# Patient Record
Sex: Female | Born: 1970 | Race: White | Hispanic: No | Marital: Single | Smoking: Never smoker
Health system: Southern US, Community
[De-identification: ages and names within clinical notes are randomized; demographics above are authoritative.]

## PROBLEM LIST (undated history)

## (undated) DIAGNOSIS — I872 Venous insufficiency (chronic) (peripheral): Secondary | ICD-10-CM

## (undated) DIAGNOSIS — I38 Endocarditis, valve unspecified: Secondary | ICD-10-CM

## (undated) DIAGNOSIS — I071 Rheumatic tricuspid insufficiency: Secondary | ICD-10-CM

## (undated) DIAGNOSIS — I82409 Acute embolism and thrombosis of unspecified deep veins of unspecified lower extremity: Secondary | ICD-10-CM

## (undated) DIAGNOSIS — I2510 Atherosclerotic heart disease of native coronary artery without angina pectoris: Secondary | ICD-10-CM

## (undated) DIAGNOSIS — I87321 Chronic venous hypertension (idiopathic) with inflammation of right lower extremity: Secondary | ICD-10-CM

## (undated) DIAGNOSIS — G629 Polyneuropathy, unspecified: Secondary | ICD-10-CM

## (undated) DIAGNOSIS — R55 Syncope and collapse: Secondary | ICD-10-CM

## (undated) HISTORY — PX: GALLBLADDER SURGERY: SHX652

## (undated) HISTORY — DX: Rheumatic tricuspid insufficiency: I07.1

## (undated) HISTORY — DX: Venous insufficiency (chronic) (peripheral): I87.2

## (undated) HISTORY — DX: Acute embolism and thrombosis of unspecified deep veins of unspecified lower extremity: I82.409

## (undated) HISTORY — DX: Chronic venous hypertension (idiopathic) with inflammation of right lower extremity: I87.321

## (undated) HISTORY — DX: Syncope and collapse: R55

## (undated) HISTORY — DX: Endocarditis, valve unspecified: I38

## (undated) HISTORY — PX: THYROIDECTOMY: SHX17

## (undated) HISTORY — PX: BACK SURGERY: SHX140

## (undated) HISTORY — PX: CARPAL TUNNEL RELEASE: SHX101

## (undated) HISTORY — PX: SLEEVE GASTROPLASTY: SHX1101

## (undated) HISTORY — PX: TOTAL HIP ARTHROPLASTY: SHX124

## (undated) HISTORY — DX: Atherosclerotic heart disease of native coronary artery without angina pectoris: I25.10

## (undated) HISTORY — PX: APPENDECTOMY: SHX54

## (undated) HISTORY — PX: KNEE SURGERY: SHX244

## (undated) HISTORY — PX: LAMINECTOMY: SHX219

## (undated) HISTORY — DX: Polyneuropathy, unspecified: G62.9

---

## 2021-05-22 ENCOUNTER — Encounter (HOSPITAL_COMMUNITY): Payer: Self-pay | Admitting: Emergency Medicine

## 2021-05-22 ENCOUNTER — Other Ambulatory Visit: Payer: Self-pay

## 2021-05-22 ENCOUNTER — Emergency Department (HOSPITAL_COMMUNITY)
Admission: EM | Admit: 2021-05-22 | Discharge: 2021-05-23 | Disposition: A | Discharge status: Home / Self Care | Payer: Medicare Other | Attending: Emergency Medicine | Admitting: Emergency Medicine

## 2021-05-22 DIAGNOSIS — R112 Nausea with vomiting, unspecified: Secondary | ICD-10-CM | POA: Insufficient documentation

## 2021-05-22 DIAGNOSIS — R1084 Generalized abdominal pain: Secondary | ICD-10-CM | POA: Insufficient documentation

## 2021-05-22 LAB — URINALYSIS, MICROSCOPIC (REFLEX): RBC / HPF: NONE SEEN RBC/hpf (ref 0–5)

## 2021-05-22 LAB — URINALYSIS, ROUTINE W REFLEX MICROSCOPIC
Bilirubin Urine: NEGATIVE
Glucose, UA: NEGATIVE mg/dL
Ketones, ur: NEGATIVE mg/dL
Leukocytes,Ua: NEGATIVE
Nitrite: NEGATIVE
Protein, ur: NEGATIVE mg/dL
Specific Gravity, Urine: <1.005 — ABNORMAL LOW (ref 1.005–1.030)
pH: 6.5 (ref 5.0–8.0)

## 2021-05-22 LAB — COMPREHENSIVE METABOLIC PANEL
ALT: 97 U/L — ABNORMAL HIGH (ref 0–44)
AST: 55 U/L — ABNORMAL HIGH (ref 15–41)
Albumin: 3.2 g/dL — ABNORMAL LOW (ref 3.5–5.0)
Alkaline Phosphatase: 212 U/L — ABNORMAL HIGH (ref 38–126)
Anion gap: 3 — ABNORMAL LOW (ref 5–15)
BUN: <5 mg/dL — ABNORMAL LOW (ref 6–20)
CO2: 22 mmol/L (ref 22–32)
Calcium: 8.3 mg/dL — ABNORMAL LOW (ref 8.9–10.3)
Chloride: 113 mmol/L — ABNORMAL HIGH (ref 98–111)
Creatinine, Ser: 0.93 mg/dL (ref 0.44–1.00)
GFR, Estimated: >60 mL/min (ref >60)
Glucose, Bld: 92 mg/dL (ref 70–99)
Potassium: 3.7 mmol/L (ref 3.5–5.1)
Sodium: 138 mmol/L (ref 135–145)
Total Bilirubin: 0.8 mg/dL (ref 0.3–1.2)
Total Protein: 5.9 g/dL — ABNORMAL LOW (ref 6.5–8.1)

## 2021-05-22 LAB — CBC WITH DIFFERENTIAL/PLATELET
Abs Immature Granulocytes: 0.01 10*3/uL (ref 0.00–0.07)
Basophils Absolute: 0 10*3/uL (ref 0.0–0.1)
Basophils Relative: 0 %
Eosinophils Absolute: 0.1 10*3/uL (ref 0.0–0.5)
Eosinophils Relative: 2 %
HCT: 32.8 % — ABNORMAL LOW (ref 36.0–46.0)
Hemoglobin: 9.4 g/dL — ABNORMAL LOW (ref 12.0–15.0)
Immature Granulocytes: 0 %
Lymphocytes Relative: 19 %
Lymphs Abs: 0.7 10*3/uL (ref 0.7–4.0)
MCH: 25.2 pg — ABNORMAL LOW (ref 26.0–34.0)
MCHC: 28.7 g/dL — ABNORMAL LOW (ref 30.0–36.0)
MCV: 87.9 fL (ref 80.0–100.0)
Monocytes Absolute: 0.2 10*3/uL (ref 0.1–1.0)
Monocytes Relative: 6 %
Neutro Abs: 2.8 10*3/uL (ref 1.7–7.7)
Neutrophils Relative %: 73 %
Platelets: 239 10*3/uL (ref 150–400)
RBC: 3.73 MIL/uL — ABNORMAL LOW (ref 3.87–5.11)
RDW: 17.2 % — ABNORMAL HIGH (ref 11.5–15.5)
WBC: 3.8 10*3/uL — ABNORMAL LOW (ref 4.0–10.5)
nRBC: 0 % (ref 0.0–0.2)

## 2021-05-22 LAB — LIPASE, BLOOD: Lipase: 27 U/L (ref 11–51)

## 2021-05-22 LAB — PROTIME-INR
INR: 1.4 — ABNORMAL HIGH (ref 0.8–1.2)
Prothrombin Time: 17.3 seconds — ABNORMAL HIGH (ref 11.4–15.2)

## 2021-05-22 MED ORDER — ONDANSETRON 4 MG PO TBDP
4.0000 mg | ORAL_TABLET | Freq: Once | ORAL | Status: AC
  Administered 2021-05-22: 4 mg via ORAL
  Filled 2021-05-22: qty 1

## 2021-05-22 NOTE — ED Notes (Signed)
Patients states one episode of emesis. Triage RN notified

## 2021-05-22 NOTE — ED Provider Notes (Signed)
Emergency Medicine Provider Triage Evaluation Note  Illona Bulman , a 50 y.o. female  was evaluated in triage.  Pt complains of epigastric pain, nausea, vomiting that is been ongoing since today.  Patient was discharged from [facility] regional yesterday after cessation of a prior GI bleed.  She is on Pradaxa for prior history of DVTs but has not taken his medication in several days.  Reports then being unable to keep anything down.  Did have a meal yesterday consisting of corn along with stuffing.  No bowel movements in the last 3 days.  Ending is nonbloody, nonbilious.  Review of Systems  Positive: Nausea, vomiting, abdominal pain Negative: Fever, diarrhea, blood in stool  Physical Exam  There were no vitals taken for this visit. Gen:   Awake, no distress   Resp:  Normal effort  MSK:   Moves extremities without difficulty  Other:  Pain with palpation along the epigastric and lower abdominal region, bowel sounds are decreased.  Medical Decision Making  Medically screening exam initiated at 5:12 PM.  Appropriate orders placed.  Titus Dubin was informed that the remainder of the evaluation will be completed by another provider, this initial triage assessment does not replace that evaluation, and the importance of remaining in the ED until their evaluation is complete.  Prior hx of GI bleed previously on Pradaxa, had intervention by GI at [facility].  Here with worsening pain, nausea, vomiting.   Claude Manges, PA-C 05/22/21 1717    Mancel Bale, MD 05/22/21 [phone]

## 2021-05-22 NOTE — ED Triage Notes (Signed)
Patient here for evaluation of abdominal pain that started a few days ago, history of a GI bleed and takes pradaxa. Patient alert, oriented, and in no apparent distress at this time.

## 2021-05-23 ENCOUNTER — Emergency Department (HOSPITAL_COMMUNITY): Payer: Medicare Other

## 2021-05-23 ENCOUNTER — Encounter (HOSPITAL_COMMUNITY): Payer: Self-pay | Admitting: Emergency Medicine

## 2021-05-23 MED ORDER — ONDANSETRON 4 MG PO TBDP: ORAL_TABLET | ORAL | 0 refills | Status: AC

## 2021-05-23 MED ORDER — ONDANSETRON HCL 4 MG/2ML IJ SOLN
4.0000 mg | Freq: Once | INTRAMUSCULAR | Status: AC
  Administered 2021-05-23: 4 mg via INTRAVENOUS
  Filled 2021-05-23: qty 2

## 2021-05-23 MED ORDER — MORPHINE SULFATE (PF) 4 MG/ML IV SOLN
4.0000 mg | Freq: Once | INTRAVENOUS | Status: AC
  Administered 2021-05-23: 4 mg via INTRAVENOUS
  Filled 2021-05-23: qty 1

## 2021-05-23 MED ORDER — IOHEXOL 300 MG/ML  SOLN
100.0000 mL | Freq: Once | INTRAMUSCULAR | Status: AC | PRN
  Administered 2021-05-23: 100 mL via INTRAVENOUS

## 2021-05-23 MED ORDER — PROMETHAZINE HCL 25 MG RE SUPP: 25.0000 mg | Freq: Four times a day (QID) | RECTAL | 0 refills | Status: DC | PRN

## 2021-05-23 MED ORDER — ALUM & MAG HYDROXIDE-SIMETH 200-200-20 MG/5ML PO SUSP
30.0000 mL | Freq: Once | ORAL | Status: AC
  Administered 2021-05-23: 30 mL via ORAL
  Filled 2021-05-23: qty 30

## 2021-05-23 MED ORDER — FAMOTIDINE IN NACL 20-0.9 MG/50ML-% IV SOLN
20.0000 mg | Freq: Once | INTRAVENOUS | Status: AC
  Administered 2021-05-23: 20 mg via INTRAVENOUS
  Filled 2021-05-23: qty 50

## 2021-05-23 MED ORDER — SODIUM CHLORIDE 0.9 % IV BOLUS
1000.0000 mL | Freq: Once | INTRAVENOUS | Status: AC
  Administered 2021-05-23: 1000 mL via INTRAVENOUS

## 2021-05-23 NOTE — Discharge Instructions (Addendum)
Try pepcid or tagamet up to twice a day.  Try to avoid things that may make this worse, most commonly these are spicy foods tomato based products fatty foods chocolate and peppermint.  Alcohol and tobacco can also make this worse.  Return to the emergency department for sudden worsening pain fever or inability to eat or drink.  

## 2021-05-23 NOTE — ED Provider Notes (Signed)
[facility] EMERGENCY DEPARTMENT Provider Note   CSN: [id number] Arrival date & time: 05/22/21  1647     History Chief Complaint  Patient presents with   Abdominal Pain    Renee Joseph is a 50 y.o. female.  64 yoF with a chief complaints of diffuse abdominal pain nausea and vomiting after having a colonoscopy and endoscopy a couple days ago.  The patient was hospitalized for a GI bleed while on Pradaxa.  She was discharged yesterday and started having intractable nausea and vomiting.  Not able to keep anything down.  No fevers or chills.  Pain to the abdomen is worse to the left upper quadrant.  The history is provided by the patient.  Abdominal Pain Pain location:  Generalized Pain quality: aching and cramping   Pain radiates to:  Does not radiate Pain severity:  Moderate Onset quality:  Gradual Duration:  2 days Timing:  Constant Progression:  Worsening Chronicity:  New Relieved by:  Nothing Worsened by:  Nothing Ineffective treatments:  None tried Associated symptoms: nausea and vomiting   Associated symptoms: no chest pain, no chills, no dysuria, no fever and no shortness of breath       History reviewed. No pertinent past medical history.  There are no problems to display for this patient.   History reviewed. No pertinent surgical history.   OB History   No obstetric history on file.     No family history on file.     Home Medications Prior to Admission medications   Medication Sig Start Date End Date Taking? Authorizing Provider  ondansetron (ZOFRAN-ODT) 4 MG disintegrating tablet 4mg  ODT q4 hours prn nausea/vomit 05/23/21  Yes Deno Etienne, DO  promethazine (PHENERGAN) 25 MG suppository Place 1 suppository (25 mg total) rectally every 6 (six) hours as needed for nausea or vomiting. 05/23/21  Yes Deno Etienne, DO    Allergies    Crab extract allergy skin test, Sulfa antibiotics, Erythromycin base, Ketorolac, Latex, Nabumetone,  Pedi-pre tape spray [wound dressing adhesive], and Penicillins  Review of Systems   Review of Systems  Constitutional:  Negative for chills and fever.  HENT:  Negative for congestion and rhinorrhea.   Eyes:  Negative for redness and visual disturbance.  Respiratory:  Negative for shortness of breath and wheezing.   Cardiovascular:  Negative for chest pain and palpitations.  Gastrointestinal:  Positive for abdominal pain, nausea and vomiting.  Genitourinary:  Negative for dysuria and urgency.  Musculoskeletal:  Negative for arthralgias and myalgias.  Skin:  Negative for pallor and wound.  Neurological:  Negative for dizziness and headaches.   Physical Exam Updated Vital Signs BP 93/64 (BP Location: Left Arm)   Pulse (!) 56   Temp 98.1 F (36.7 C)   Resp 18   SpO2 100%   Physical Exam Vitals and nursing note reviewed.  Constitutional:      General: She is not in acute distress.    Appearance: She is well-developed. She is not diaphoretic.  HENT:     Head: Normocephalic and atraumatic.  Eyes:     Pupils: Pupils are equal, round, and reactive to light.  Cardiovascular:     Rate and Rhythm: Normal rate and regular rhythm.     Heart sounds: No murmur heard.   No friction rub. No gallop.  Pulmonary:     Effort: Pulmonary effort is normal.     Breath sounds: No wheezing or rales.  Abdominal:     General: There  is no distension.     Palpations: Abdomen is soft.     Tenderness: There is abdominal tenderness (mild diffuse tenderness).  Musculoskeletal:        General: No tenderness.     Cervical back: Normal range of motion and neck supple.  Skin:    General: Skin is warm and dry.  Neurological:     Mental Status: She is alert and oriented to person, place, and time.  Psychiatric:        Behavior: Behavior normal.    ED Results / Procedures / Treatments   Labs (all labs ordered are listed, but only abnormal results are displayed) Labs Reviewed  CBC WITH  DIFFERENTIAL/PLATELET - Abnormal; Notable for the following components:      Result Value   WBC 3.8 (*)    RBC 3.73 (*)    Hemoglobin 9.4 (*)    HCT 32.8 (*)    MCH 25.2 (*)    MCHC 28.7 (*)    RDW 17.2 (*)    All other components within normal limits  COMPREHENSIVE METABOLIC PANEL - Abnormal; Notable for the following components:   Chloride 113 (*)    BUN <5 (*)    Calcium 8.3 (*)    Total Protein 5.9 (*)    Albumin 3.2 (*)    AST 55 (*)    ALT 97 (*)    Alkaline Phosphatase 212 (*)    Anion gap 3 (*)    All other components within normal limits  URINALYSIS, ROUTINE W REFLEX MICROSCOPIC - Abnormal; Notable for the following components:   Specific Gravity, Urine <1.005 (*)    Hgb urine dipstick TRACE (*)    All other components within normal limits  PROTIME-INR - Abnormal; Notable for the following components:   Prothrombin Time 17.3 (*)    INR 1.4 (*)    All other components within normal limits  URINALYSIS, MICROSCOPIC (REFLEX) - Abnormal; Notable for the following components:   Bacteria, UA RARE (*)    All other components within normal limits  LIPASE, BLOOD    EKG None  Radiology CT ABDOMEN PELVIS W CONTRAST  Result Date: 05/23/2021 CLINICAL DATA:  Abdominal pain EXAM: CT ABDOMEN AND PELVIS WITH CONTRAST TECHNIQUE: Multidetector CT imaging of the abdomen and pelvis was performed using the standard protocol following bolus administration of intravenous contrast. CONTRAST:  OMNIPAQUE IOHEXOL 300 MG/ML  SOLN COMPARISON:  CT abdomen and pelvis 05/19/2021 FINDINGS: Lower chest: No acute abnormality. Hepatobiliary: Liver is normal in size and contour with no suspicious mass identified. Gallbladder is surgically absent. Mildly dilated extrahepatic biliary ducts, common bile duct measures 9 mm in diameter with tapering distally. Likely compensatory from cholecystectomy. Pancreas: Unremarkable. No pancreatic ductal dilatation or surrounding inflammatory changes. Spleen:  Normal in size without focal abnormality. Adrenals/Urinary Tract: Adrenal glands are unremarkable. Kidneys are normal, without renal calculi, focal lesion, or hydronephrosis. Bladder is grossly unremarkable, limited evaluation due to metallic artifacts. Stomach/Bowel: Postsurgical changes of the stomach and bowel, unchanged. No bowel obstruction, free air or pneumatosis. No bowel wall edema identified. Appendectomy changes. Vascular/Lymphatic: No significant vascular findings are present. No enlarged abdominal or pelvic lymph nodes. Reproductive: Uterus and bilateral adnexa are unremarkable. Other: No ascites. Musculoskeletal: Degenerative and postsurgical changes in the lumbar spine. Bilateral hip arthroplasties. No suspicious bony lesions identified. IMPRESSION: No acute process identified. Stable postsurgical and chronic changes. Electronically Signed   By: Jannifer Hick M.D.   On: 05/23/2021 09:15    Procedures Procedures  Medications Ordered in ED Medications  ondansetron (ZOFRAN-ODT) disintegrating tablet 4 mg (4 mg Oral Given 05/22/21 1753)  morphine 4 MG/ML injection 4 mg (4 mg Intravenous Given 05/23/21 0815)  ondansetron (ZOFRAN) injection 4 mg (4 mg Intravenous Given 05/23/21 0810)  sodium chloride 0.9 % bolus 1,000 mL (0 mLs Intravenous Stopped 05/23/21 0954)  famotidine (PEPCID) IVPB 20 mg premix (0 mg Intravenous Stopped 05/23/21 0849)  iohexol (OMNIPAQUE) 300 MG/ML solution 100 mL (100 mLs Intravenous Contrast Given 05/23/21 0906)  ondansetron (ZOFRAN) injection 4 mg (4 mg Intravenous Given 05/23/21 0929)  alum & mag hydroxide-simeth (MAALOX/MYLANTA) 200-200-20 MG/5ML suspension 30 mL (30 mLs Oral Given 05/23/21 [id number])    ED Course  I have reviewed the triage vital signs and the nursing notes.  Pertinent labs & imaging results that were available during my care of the patient were reviewed by me and considered in my medical decision making (see chart for details).    MDM  Rules/Calculators/A&P                           50 yo F with a cc of n/v abdominal pain after having endoscopy and colonoscopy while at [facility].  We will obtain a CT scan to evaluate for possible perforation.  Hemoglobin is somewhat higher than it was at last check.  She does have mild LFT elevation of unknown significance.  Give pain and nausea medicine.  Bolus of IV fluids.  Pepcid.  Reassess.  CT without concerning finding.  Symptoms improved.  Oral trial.    Tolerating by mouth without issue.   9:55 AM:  I have discussed the diagnosis/risks/treatment options with the patient and believe the pt to be eligible for discharge home to follow-up with GI, PCP. We also discussed returning to the ED immediately if new or worsening sx occur. We discussed the sx which are most concerning (e.g., sudden worsening pain, fever, inability to tolerate by mouth) that necessitate immediate return. Medications administered to the patient during their visit and any new prescriptions provided to the patient are listed below.  Medications given during this visit Medications  ondansetron (ZOFRAN-ODT) disintegrating tablet 4 mg (4 mg Oral Given 05/22/21 1753)  morphine 4 MG/ML injection 4 mg (4 mg Intravenous Given 05/23/21 0815)  ondansetron (ZOFRAN) injection 4 mg (4 mg Intravenous Given 05/23/21 0810)  sodium chloride 0.9 % bolus 1,000 mL (0 mLs Intravenous Stopped 05/23/21 0954)  famotidine (PEPCID) IVPB 20 mg premix (0 mg Intravenous Stopped 05/23/21 0849)  iohexol (OMNIPAQUE) 300 MG/ML solution 100 mL (100 mLs Intravenous Contrast Given 05/23/21 0906)  ondansetron (ZOFRAN) injection 4 mg (4 mg Intravenous Given 05/23/21 0929)  alum & mag hydroxide-simeth (MAALOX/MYLANTA) 200-200-20 MG/5ML suspension 30 mL (30 mLs Oral Given 05/23/21 [id number])     The patient appears reasonably screen and/or stabilized for discharge and I doubt any other medical condition or other [facility] requiring further screening, evaluation, or  treatment in the ED at this time prior to discharge.    Final Clinical Impression(s) / ED Diagnoses Final diagnoses:  Nausea and vomiting in adult    Rx / DC Orders ED Discharge Orders          Ordered    promethazine (PHENERGAN) 25 MG suppository  Every 6 hours PRN        05/23/21 0934    ondansetron (ZOFRAN-ODT) 4 MG disintegrating tablet        05/23/21 0934  Deno Etienne, DO 05/23/21 [phone]

## 2021-05-28 ENCOUNTER — Ambulatory Visit: Payer: Self-pay | Admitting: Cardiovascular Disease

## 2021-06-29 NOTE — Progress Notes (Signed)
Cardiology Office Note:    Date:  06/30/2021   ID:  Renee Joseph, DOB [birth year], MRN [id number]  PCP:  System, Provider Not In   [facility] Providers Cardiologist:  Marylyn Appenzeller   Referring MD: No ref. provider found   Chief Complaint  Patient presents with   Coronary Artery Disease   Palpitations    Jan. 9 2023    Renee Joseph is a 51 y.o. female with a hx of CAD, ( mild CAD by CT angiogram )  DVT,    She is seen for the first time today for follow up of episodes of CP, palpitations, dyspnea.  She had several studies while out in [place]  Echo shows normal LV systolic function - EF 70%.  Trivial MR, mild TR  Carotid duplex showed no significant carotid artery disease  Has been on pradaxa for several years for DVT   Having more DOE ,  More palpitations - usually at rest   No regularl exercise  Had left knee replacement - 8-9 months ago .    Is limited by her joint pain  Has hereditary DJJD.  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 Sodium level was 140.  Potassium is 4.5.  Chloride is 110, BUN is 8, creatinine is 0.72   Past Medical History:  Diagnosis Date   CAD (coronary artery disease)    Chronic venous hypertension (idiopathic) with inflammation of right lower extremity    Chronic venous insufficiency    DVT (deep venous thrombosis) (HCC)    Neuropathy    Syncope, vasovagal    Tricuspid valve regurgitation    Valvular heart disease     Past Surgical History:  Procedure Laterality Date   APPENDECTOMY     BACK SURGERY     CARPAL TUNNEL RELEASE     GALLBLADDER SURGERY     KNEE SURGERY Bilateral    LAMINECTOMY     SLEEVE GASTROPLASTY     THYROIDECTOMY     TOTAL HIP ARTHROPLASTY      Current  Medications: Current Meds  Medication Sig   ALPRAZolam (XANAX) 0.5 MG tablet Take 0.5 mg by mouth every morning.   cyclobenzaprine (FLEXERIL) 5 MG tablet Take 5 mg by mouth 3 (three) times daily as needed.   dabigatran (PRADAXA) 150 MG CAPS capsule Take 150 mg by mouth 2 (two) times daily.   furosemide (LASIX) 40 MG tablet Take 40 mg by mouth.   linaclotide (LINZESS) 145 MCG CAPS capsule Take 145 mcg by mouth daily.   metoprolol tartrate (LOPRESSOR) 25 MG tablet Take 0.5 tablets (12.5 mg total) by mouth 2 (two) times daily.   omeprazole (PRILOSEC) 20 MG capsule Take 20 mg by mouth daily.   ondansetron (ZOFRAN-ODT) 4 MG disintegrating tablet 4mg  ODT q4 hours prn nausea/vomit   pantoprazole (PROTONIX) 40 MG tablet Take 40 mg by mouth daily.   promethazine (PHENERGAN) 25 MG suppository Place 1 suppository (25 mg total) rectally every 6 (six) hours as needed for nausea or vomiting.   sucralfate (CARAFATE) 1 g tablet Take 1 g by mouth 4 (four) times daily -  with meals and at bedtime.   zolpidem (  AMBIEN) 10 MG tablet Take 10 mg by mouth at bedtime as needed.   [DISCONTINUED] cyclobenzaprine (FLEXERIL) 10 MG tablet Take 5 mg by mouth 3 (three) times daily as needed for muscle spasms.     Allergies:   Crab extract allergy skin test, Other, Sulfa antibiotics, Tramadol, Erythromycin base, Ketorolac, Latex, Nabumetone, Pedi-pre tape spray [wound dressing adhesive], Penicillins, and Pregabalin   Social History   Socioeconomic History   Marital status: Single    Spouse name: Not on file   Number of children: Not on file   Years of education: Not on file   Highest education level: Not on file  Occupational History   Not on file  Tobacco Use   Smoking status: Never   Smokeless tobacco: Never  Substance and Sexual Activity   Alcohol use: Never   Drug use: Never   Sexual activity: Not on file  Other Topics Concern   Not on file  Social History Narrative   Not on file   Social Determinants  of Health   Financial Resource Strain: Not on file  Food Insecurity: Not on file  Transportation Needs: Not on file  Physical Activity: Not on file  Stress: Not on file  Social Connections: Not on file     Family History: The patient's family history includes Diabetes in her mother; Heart disease in her brother; Hypertension in her father and mother; Stroke in her father.  ROS:   Please see the history of present illness.     All other systems reviewed and are negative.  EKGs/Labs/Other Studies Reviewed:    The following studies were reviewed today:   EKG: June 30, 2021: Normal sinus rhythm at 98.  No ST or T wave changes.  Recent Labs: 05/22/2021: ALT 97; BUN <5; Creatinine, Ser 0.93; Hemoglobin 9.4; Platelets 239; Potassium 3.7; Sodium 138  Recent Lipid Panel No results found for: CHOL, TRIG, HDL, CHOLHDL, VLDL, LDLCALC, LDLDIRECT   Risk Assessment/Calculations:           Physical Exam:    VS:  BP 118/76 (BP Location: Left Arm, Patient Position: Sitting, Cuff Size: Normal)    Pulse 98    Ht 5\' 4"  (1.626 m)    Wt 208 lb (94.3 kg)    SpO2 99%    BMI 35.70 kg/m     Wt Readings from Last 3 Encounters:  06/30/21 208 lb (94.3 kg)    GEN: Young, moderately obese female, in no acute distress HEENT: Normal NECK: No JVD; No carotid bruits LYMPHATICS: No lymphadenopathy CARDIAC: RRR,  , very soft systolic murmur  RESPIRATORY:  Clear to auscultation without rales, wheezing or rhonchi  ABDOMEN: Soft, non-tender, non-distended MUSCULOSKELETAL:  No edema; No deformity  SKIN: Warm and dry NEUROLOGIC:  Alert and oriented x 3 PSYCHIATRIC:  Normal affect   ASSESSMENT:    1. Deep vein thrombosis (DVT) of other vein of lower extremity, unspecified chronicity, unspecified laterality (HCC)   2. Palpitations    PLAN:    In order of problems listed above:  Palpitations: She has palpitations.  I suspect these are premature ventricular contractions or perhaps premature atrial  contractions.  We will start her on metoprolol 12.5 mg twice a day.  She will follow-up with an APP in 3 months.           Medication Adjustments/Labs and Tests Ordered: Current medicines are reviewed at length with the patient today.  Concerns regarding medicines are outlined above.  Orders Placed This Encounter  Procedures   Ambulatory referral to Vascular Surgery   EKG 12-Lead   Meds ordered this encounter  Medications   metoprolol tartrate (LOPRESSOR) 25 MG tablet    Sig: Take 0.5 tablets (12.5 mg total) by mouth 2 (two) times daily.    Dispense:  90 tablet    Refill:  3    Patient Instructions  Medication Instructions:  Metoprolol 12.5 mg twice a day   *If you need a refill on your cardiac medications before your next appointment, please call your pharmacy*   Lab Work: none If you have labs (blood work) drawn today and your tests are completely normal, you will receive your results only by: MyChart Message (if you have MyChart) OR A paper copy in the mail If you have any lab test that is abnormal or we need to change your treatment, we will call you to review the results.   Testing/Procedures: none   Follow-Up: At [facility], you and your health needs are our priority.  As part of our continuing mission to provide you with exceptional heart care, we have created designated Provider Care Teams.  These Care Teams include your primary Cardiologist (physician) and Advanced Practice Providers (APPs -  Physician Assistants and Nurse Practitioners) who all work together to provide you with the care you need, when you need it.  We recommend signing up for the patient portal called "MyChart".  Sign up information is provided on this After Visit Summary.  MyChart is used to connect with patients for Virtual Visits (Telemedicine).  Patients are able to view lab/test results, encounter notes, upcoming appointments, etc.  Non-urgent messages can be sent to your provider as  well.   To learn more about what you can do with MyChart, go to [email or web address].    Your next appointment:   3 month(s)  The format for your next appointment:   In Person  Provider:   Tereso NewcomerScott Weaver, PA-C         Other Instructions Referral to Vein and Vascular     Signed, Kristeen MissPhilip Mao Lockner, MD  06/30/2021 3:12 PM    [facility]

## 2021-06-30 ENCOUNTER — Other Ambulatory Visit: Payer: Self-pay

## 2021-06-30 ENCOUNTER — Encounter: Payer: Self-pay | Admitting: Cardiovascular Disease

## 2021-06-30 ENCOUNTER — Ambulatory Visit (INDEPENDENT_AMBULATORY_CARE_PROVIDER_SITE_OTHER): Payer: Medicare Other | Admitting: Cardiovascular Disease

## 2021-06-30 VITALS — BP 118/76 | HR 98 | Ht 64.0 in | Wt 208.0 lb

## 2021-06-30 DIAGNOSIS — I2510 Atherosclerotic heart disease of native coronary artery without angina pectoris: Secondary | ICD-10-CM | POA: Diagnosis not present

## 2021-06-30 DIAGNOSIS — I82499 Acute embolism and thrombosis of other specified deep vein of unspecified lower extremity: Secondary | ICD-10-CM | POA: Diagnosis not present

## 2021-06-30 DIAGNOSIS — R002 Palpitations: Secondary | ICD-10-CM | POA: Diagnosis not present

## 2021-06-30 MED ORDER — METOPROLOL TARTRATE 25 MG PO TABS: 12.5000 mg | ORAL_TABLET | Freq: Two times a day (BID) | ORAL | 3 refills | Status: DC

## 2021-06-30 NOTE — Patient Instructions (Signed)
Medication Instructions:  Metoprolol 12.5 mg twice a day   *If you need a refill on your cardiac medications before your next appointment, please call your pharmacy*   Lab Work: none If you have labs (blood work) drawn today and your tests are completely normal, you will receive your results only by: MyChart Message (if you have MyChart) OR A paper copy in the mail If you have any lab test that is abnormal or we need to change your treatment, we will call you to review the results.   Testing/Procedures: none   Follow-Up: At [facility], you and your health needs are our priority.  As part of our continuing mission to provide you with exceptional heart care, we have created designated Provider Care Teams.  These Care Teams include your primary Cardiologist (physician) and Advanced Practice Providers (APPs -  Physician Assistants and Nurse Practitioners) who all work together to provide you with the care you need, when you need it.  We recommend signing up for the patient portal called "MyChart".  Sign up information is provided on this After Visit Summary.  MyChart is used to connect with patients for Virtual Visits (Telemedicine).  Patients are able to view lab/test results, encounter notes, upcoming appointments, etc.  Non-urgent messages can be sent to your provider as well.   To learn more about what you can do with MyChart, go to [email or web address].    Your next appointment:   3 month(s)  The format for your next appointment:   In Person  Provider:   Tereso Newcomer, PA-C         Other Instructions Referral to Vein and Vascular

## 2021-08-04 ENCOUNTER — Other Ambulatory Visit: Payer: Self-pay

## 2021-08-04 DIAGNOSIS — I82409 Acute embolism and thrombosis of unspecified deep veins of unspecified lower extremity: Secondary | ICD-10-CM

## 2021-08-11 NOTE — Progress Notes (Signed)
[facility] [place]  ASSESSMENT / PLAN: Renee Joseph is a 51 y.o. 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  She has been maintained on anticoagulation for greater than 3 years.  She can stop anticoagulation from my perspective.  I will reach out to Dr. Acie Fredrickson to confirm this is safe from a cardiac perspective. She can follow up with me as needed.  CHIEF COMPLAINT: history of DVT  HISTORY OF PRESENT ILLNESS: Renee Joseph is a 51 y.o. female with past history of right femoral-popliteal deep venous thrombosis.  She is been maintained on Pradaxa for over 3 years.  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  She has never had any DVTs since.  She is hopeful to come off of anticoagulation.  Past Medical History:  Diagnosis Date   CAD (coronary artery disease)    Chronic venous hypertension (idiopathic) with inflammation of right lower extremity    Chronic venous insufficiency    DVT (deep venous thrombosis) (HCC)    Neuropathy    Syncope, vasovagal    Tricuspid valve regurgitation    Valvular heart disease     Past Surgical History:  Procedure Laterality Date   APPENDECTOMY     BACK SURGERY     CARPAL TUNNEL RELEASE     GALLBLADDER SURGERY     KNEE SURGERY Bilateral    LAMINECTOMY     SLEEVE GASTROPLASTY     THYROIDECTOMY     TOTAL HIP ARTHROPLASTY      Family History  Problem Relation Age of Onset   Hypertension Mother    Diabetes Mother    Hypertension Father    Stroke Father    Heart disease Brother     Social History   Socioeconomic History   Marital status: Single    Spouse name: Not on file   Number of children: Not on file   Years of education: Not on file   Highest education level: Not on file  Occupational History   Not on file  Tobacco Use   Smoking status: Never   Smokeless tobacco: Never   Substance and Sexual Activity   Alcohol use: Never   Drug use: Never   Sexual activity: Not on file  Other Topics Concern   Not on file  Social History Narrative   Not on file   Social Determinants of Health   Financial Resource Strain: Not on file  Food Insecurity: Not on file  Transportation Needs: Not on file  Physical Activity: Not on file  Stress: Not on file  Social Connections: Not on file  Intimate Partner Violence: Not on file    Allergies  Allergen Reactions   Crab Extract Allergy Skin Test Anaphylaxis    Only crab but can eat all other shellfish   Other Itching    All "Cillinis"   Sulfa Antibiotics Anaphylaxis   Tramadol Itching   Erythromycin Base Hives   Ketorolac Hives   Latex Hives   Nabumetone Hives   Pedi-Pre Tape Spray [Wound Dressing Adhesive] Hives    Plastic   Penicillins Hives   Pregabalin Itching    Current Outpatient Medications  Medication Sig Dispense Refill   ALPRAZolam (XANAX) 0.5 MG tablet Take 0.5 mg by mouth every morning.     cyclobenzaprine (FLEXERIL) 5 MG tablet Take 5 mg by mouth 3 (three) times daily as needed.  dabigatran (PRADAXA) 150 MG CAPS capsule Take 150 mg by mouth 2 (two) times daily.     linaclotide (LINZESS) 145 MCG CAPS capsule Take 145 mcg by mouth daily.     metoprolol tartrate (LOPRESSOR) 25 MG tablet Take 0.5 tablets (12.5 mg total) by mouth 2 (two) times daily. 90 tablet 3   omeprazole (PRILOSEC) 20 MG capsule Take 20 mg by mouth daily.     ondansetron (ZOFRAN-ODT) 4 MG disintegrating tablet 4mg  ODT q4 hours prn nausea/vomit 20 tablet 0   pantoprazole (PROTONIX) 40 MG tablet Take 40 mg by mouth daily.     sucralfate (CARAFATE) 1 g tablet Take 1 g by mouth 4 (four) times daily -  with meals and at bedtime.     zolpidem (AMBIEN) 10 MG tablet Take 10 mg by mouth at bedtime as needed.     No current facility-administered medications for this visit.    PHYSICAL EXAM Vitals:   08/12/21 1358  BP: 101/72   Pulse: 92  Resp: 20  Temp: 98.5 F (36.9 C)  SpO2: 97%  Weight: 207 lb (93.9 kg)  Height: 5\' 4"  (1.626 m)    Constitutional: well appearing. no distress. Appears well nourished.  Neurologic: CN intact. no focal findings. no sensory loss. Psychiatric:  Mood and affect symmetric and appropriate. Eyes:  No icterus. No conjunctival pallor. Ears, nose, throat:  mucous membranes moist. Midline trachea.  Cardiac: regular rate and rhythm.  Respiratory:  unlabored. Abdominal:  soft, non-tender, non-distended.  Peripheral vascular: 2+ DP Extremity: no edema. no cyanosis. no pallor.  Skin: no gangrene. no ulceration.  Lymphatic: no Stemmer's sign. no palpable lymphadenopathy.  PERTINENT LABORATORY AND RADIOLOGIC DATA  Most recent CBC CBC Latest Ref Rng & Units 05/22/2021  WBC 4.0 - 10.5 K/uL 3.8(L)  Hemoglobin 12.0 - 15.0 g/dL 9.4(L)  Hematocrit 36.0 - 46.0 % 32.8(L)  Platelets 150 - 400 K/uL 239     Most recent CMP CMP Latest Ref Rng & Units 05/22/2021  Glucose 70 - 99 mg/dL 92  BUN 6 - 20 mg/dL <5(L)  Creatinine 0.44 - 1.00 mg/dL 0.93  Sodium 135 - 145 mmol/L 138  Potassium 3.5 - 5.1 mmol/L 3.7  Chloride 98 - 111 mmol/L 113(H)  CO2 22 - 32 mmol/L 22  Calcium 8.9 - 10.3 mg/dL 8.3(L)  Total Protein 6.5 - 8.1 g/dL 5.9(L)  Total Bilirubin 0.3 - 1.2 mg/dL 0.8  Alkaline Phos 38 - 126 U/L 212(H)  AST 15 - 41 U/L 55(H)  ALT 0 - 44 U/L 97(H)   Vascular imaging: Right:  - Color duplex evaluation of the right lower extremity shows there is  chronic thrombus in the distal femoral vein and popliteal vein.  - Venous reflux is noted in the right common femoral vein.  - Venous reflux is noted in the right greater saphenous vein in the calf.  - Venous reflux is noted in the right popliteal vein.  - Venous reflux is noted in the right perforator vein.     Left:  - No evidence of deep vein thrombosis seen in the left lower extremity,  from the common femoral through the popliteal  veins.  - There is no evidence of venous reflux seen in the left lower extremity.   Yevonne Aline. Stanford Breed, MD [facility] Phone Number: [phone] 08/13/2021 4:59 PM  Total time spent on preparing this encounter including chart review, data review, collecting history, examining the patient, coordinating care for this new  patient, 45 minutes.  Portions of this report may have been transcribed using voice recognition software.  Every effort has been made to ensure accuracy; however, inadvertent computerized transcription errors may still be present.

## 2021-08-12 ENCOUNTER — Encounter: Payer: Self-pay | Admitting: Vascular Surgery

## 2021-08-12 ENCOUNTER — Ambulatory Visit (HOSPITAL_COMMUNITY)
Admission: RE | Admit: 2021-08-12 | Discharge: 2021-08-12 | Disposition: A | Discharge status: Home / Self Care | Payer: Medicare Other | Source: Ambulatory Visit | Attending: Vascular Surgery | Admitting: Vascular Surgery

## 2021-08-12 ENCOUNTER — Other Ambulatory Visit: Payer: Self-pay

## 2021-08-12 ENCOUNTER — Ambulatory Visit (INDEPENDENT_AMBULATORY_CARE_PROVIDER_SITE_OTHER): Payer: Medicare Other | Admitting: Vascular Surgery

## 2021-08-12 VITALS — BP 101/72 | HR 92 | Temp 98.5°F | Resp 20 | Ht 64.0 in | Wt 207.0 lb

## 2021-08-12 DIAGNOSIS — I82409 Acute embolism and thrombosis of unspecified deep veins of unspecified lower extremity: Secondary | ICD-10-CM | POA: Diagnosis not present

## 2021-08-12 DIAGNOSIS — Z86718 Personal history of other venous thrombosis and embolism: Secondary | ICD-10-CM | POA: Diagnosis not present

## 2021-08-28 ENCOUNTER — Encounter: Payer: Self-pay | Admitting: Vascular Surgery

## 2021-10-07 ENCOUNTER — Ambulatory Visit: Payer: Medicare Other | Admitting: Physician Assistant

## 2021-10-21 ENCOUNTER — Ambulatory Visit (INDEPENDENT_AMBULATORY_CARE_PROVIDER_SITE_OTHER): Payer: Medicare Other | Admitting: Cardiovascular Disease

## 2021-10-21 ENCOUNTER — Encounter: Payer: Self-pay | Admitting: Cardiovascular Disease

## 2021-10-21 VITALS — BP 122/76 | HR 96 | Ht 64.0 in | Wt 213.6 lb

## 2021-10-21 DIAGNOSIS — R002 Palpitations: Secondary | ICD-10-CM | POA: Diagnosis not present

## 2021-10-21 DIAGNOSIS — I2510 Atherosclerotic heart disease of native coronary artery without angina pectoris: Secondary | ICD-10-CM

## 2021-10-21 MED ORDER — METOPROLOL TARTRATE 25 MG PO TABS: 25.0000 mg | ORAL_TABLET | Freq: Two times a day (BID) | ORAL | 3 refills | Status: DC

## 2021-10-21 NOTE — Progress Notes (Signed)
?Cardiology Office Note:   ? ?Date:  10/21/2021  ? ?ID:  Renee Joseph, DOB Sep 25, 1970, MRN [id number] ? ?PCP:  Arlan Organ, MD ?  ?[facility] Providers ?Cardiologist:  Vinetta Brach  ? ?Referring MD: Arlan Organ, MD  ? ?Chief Complaint  ?Patient presents with  ? Palpitations  ? ? ?Jan. 9 2023 ?  ? ?Renee Joseph is a 51 y.o. female with a hx of CAD, ( mild CAD by CT angiogram )  DVT,   ? ?She is seen for the first time today for follow up of episodes of CP, palpitations, dyspnea.  She had several studies while out in [place]  ?Echo shows normal LV systolic function - EF 70%.  Trivial MR, mild TR  ?Carotid duplex showed no significant carotid artery disease ? ?Has been on pradaxa for several years for DVT  ? ?Having more DOE ,  ?More palpitations - usually at rest  ? ?No regularl exercise  ?Had left knee replacement - 8-9 months ago .    ?Is limited by her joint pain  ?Has hereditary DJJD.  ?Has had both hips replaced,   spinal surgery ,   lumbar surgery , laminectomy in her neck  ? 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 ?No syncope   ? ?Labs from November, 2022 show a normal TSH at 3.55. ?Sodium level was 140.  Potassium is 4.5.  Chloride is 110, BUN is 8, creatinine is 0.72 ? ?Oct 21, 2021: ? ?See William Hamburger, fiance ?She is seen for follow-up of her palpitations. ?She has a history of obesity and gastric bypass surgery.  Weight today is 213 pounds. ? ?Her palpitations are more frequent  ?With activity or at rest  ?We started her on metoprolol 12. 5 BID  ?She had a event monitor in September, 2021 which showed episodes of nonsustained supraventricular tachycardia.  The longest episode was 12 beats with a max heart rate of 170s. ? ?I suspect that we did not start high enough dose of metoprolol.  We will increase the Toprol to 25 mg twice a day. ? ?Will get a BMP and TSH today ? ?Past Medical History:   ?Diagnosis Date  ? CAD (coronary artery disease)   ? Chronic venous hypertension (idiopathic) with inflammation of right lower extremity   ? Chronic venous insufficiency   ? DVT (deep venous thrombosis) (HCC)   ? Neuropathy   ? Syncope, vasovagal   ? Tricuspid valve regurgitation   ? Valvular heart disease   ? ? ?Past Surgical History:  ?Procedure Laterality Date  ? APPENDECTOMY    ? BACK SURGERY    ? CARPAL TUNNEL RELEASE    ? GALLBLADDER SURGERY    ? KNEE SURGERY Bilateral   ? LAMINECTOMY    ? SLEEVE GASTROPLASTY    ? THYROIDECTOMY    ? TOTAL HIP ARTHROPLASTY    ? ? ?Current Medications: ?Current Meds  ?Medication Sig  ? ALPRAZolam (XANAX) 0.5 MG tablet Take 0.5 mg by mouth every morning.  ? cyclobenzaprine (FLEXERIL) 10 MG tablet Take 10 mg by mouth 3 (three) times daily.  ? dabigatran (PRADAXA) 150 MG CAPS capsule Take 150 mg by mouth 2 (two) times daily.  ? diclofenac (VOLTAREN) 75 MG EC tablet Take 75 mg by mouth 2 (two) times daily.  ? EPINEPHrine 0.3 mg/0.3 mL IJ SOAJ injection 1  pre-filled pen syringe IM PRN  ? FEROSUL 325 (65 Fe) MG tablet Take 325 mg by mouth daily.  ? gabapentin (NEURONTIN) 300 MG capsule Take 600 mg by mouth 3 (three) times daily.  ? linaclotide (LINZESS) 145 MCG CAPS capsule Take 145 mcg by mouth daily.  ? metoprolol tartrate (LOPRESSOR) 25 MG tablet Take 1 tablet (25 mg total) by mouth 2 (two) times daily.  ? omeprazole (PRILOSEC) 20 MG capsule Take 20 mg by mouth daily.  ? ondansetron (ZOFRAN-ODT) 4 MG disintegrating tablet 4mg  ODT q4 hours prn nausea/vomit  ? pantoprazole (PROTONIX) 40 MG tablet Take 40 mg by mouth daily.  ? sucralfate (CARAFATE) 1 g tablet Take 1 g by mouth 4 (four) times daily -  with meals and at bedtime.  ? venlafaxine XR (EFFEXOR-XR) 75 MG 24 hr capsule Take 225 mg by mouth daily.  ? zolpidem (AMBIEN) 10 MG tablet Take 10 mg by mouth at bedtime as needed.  ? [DISCONTINUED] metoprolol tartrate (LOPRESSOR) 25 MG tablet Take 0.5 tablets (12.5 mg total) by mouth 2  (two) times daily.  ?  ? ?Allergies:   Crab extract allergy skin test, Other, Sulfa antibiotics, Apixaban, Erythromycin base, Ketorolac, Latex, Nabumetone, Pedi-pre tape spray [wound dressing adhesive], Penicillins, Pregabalin, Rivaroxaban, and Toradol [ketorolac tromethamine]  ? ?Social History  ? ?Socioeconomic History  ? Marital status: Single  ?  Spouse name: Not on file  ? Number of children: Not on file  ? Years of education: Not on file  ? Highest education level: Not on file  ?Occupational History  ? Not on file  ?Tobacco Use  ? Smoking status: Never  ? Smokeless tobacco: Never  ?Substance and Sexual Activity  ? Alcohol use: Never  ? Drug use: Never  ? Sexual activity: Not on file  ?Other Topics Concern  ? Not on file  ?Social History Narrative  ? Not on file  ? ?Social Determinants of Health  ? ?Financial Resource Strain: Not on file  ?Food Insecurity: Not on file  ?Transportation Needs: Not on file  ?Physical Activity: Not on file  ?Stress: Not on file  ?Social Connections: Not on file  ?  ? ?Family History: ?The patient's family history includes Diabetes in her mother; Heart disease in her brother; Hypertension in her father and mother; Stroke in her father. ? ?ROS:   ?Please see the history of present illness.    ? All other systems reviewed and are negative. ? ?EKGs/Labs/Other Studies Reviewed:   ? ?The following studies were reviewed today: ? ? ?EKG:   ? ?Recent Labs: ?05/22/2021: ALT 97; BUN <5; Creatinine, Ser 0.93; Hemoglobin 9.4; Platelets 239; Potassium 3.7; Sodium 138  ?Recent Lipid Panel ?No results found for: CHOL, TRIG, HDL, CHOLHDL, VLDL, LDLCALC, LDLDIRECT ? ? ?Risk Assessment/Calculations:   ?  ? ?    ? ?Physical Exam:   ? ?Physical Exam: ?Blood pressure 122/76, pulse 96, height 5\' 4"  (1.626 m), weight 213 lb 9.6 oz (96.9 kg), SpO2 97 %. ? ?GEN:  Well nourished, well developed in no acute distress ?HEENT: Normal ?NECK: No JVD; No carotid bruits ?LYMPHATICS: No lymphadenopathy ?CARDIAC: RRR ,  no murmurs, rubs, gallops ?RESPIRATORY:  Clear to auscultation without rales, wheezing or rhonchi  ?ABDOMEN: Soft, non-tender, non-distended ?MUSCULOSKELETAL:  No edema; No deformity  ?SKIN: Warm and dry ?NEUROLOGIC:  Alert and oriented x 3 ? ? ?ASSESSMENT:   ? ?1. Palpitations   ? ? ?PLAN:   ? ?  ? ?Palpitations: She now presents  for worsening palpitations.  We will increase the metoprolol to 25 mg twice a day.  She will let me know via MyChart message if her palpitations continue to be problematic.  I would probably repeat her event monitor. ? ?Check TSH and basic metabolic profile today to see if there is any other etiology for her palpitations.  She states that she is not on any additional new medications. ? ? ?Medication Adjustments/Labs and Tests Ordered: ?Current medicines are reviewed at length with the patient today.  Concerns regarding medicines are outlined above.  ?Orders Placed This Encounter  ?Procedures  ? TSH  ? Basic metabolic panel  ? ?Meds ordered this encounter  ?Medications  ? metoprolol tartrate (LOPRESSOR) 25 MG tablet  ?  Sig: Take 1 tablet (25 mg total) by mouth 2 (two) times daily.  ?  Dispense:  180 tablet  ?  Refill:  3  ? ? ?Patient Instructions  ?Medication Instructions:  ?Increase Metoprolol to twice daily ? ?*If you need a refill on your cardiac medications before your next appointment, please call your pharmacy* ? ? ?Lab Work: ?TSH, BMET today ?If you have labs (blood work) drawn today and your tests are completely normal, you will receive your results only by: ?MyChart Message (if you have MyChart) OR ?A paper copy in the mail ?If you have any lab test that is abnormal or we need to change your treatment, we will call you to review the results. ? ? ?Testing/Procedures: ?NONE ? ? ?Follow-Up: ?[facility], you and your health needs are our priority.  As part of our continuing mission to provide you with exceptional heart care, we have created designated Provider Care Teams.   These Care Teams include your primary Cardiologist (physician) and Advanced Practice Providers (APPs -  Physician Assistants and Nurse Practitioners) who all work together to provide you with the care you need,

## 2021-10-21 NOTE — Patient Instructions (Addendum)
Medication Instructions:  ?Increase Metoprolol to twice daily ? ?*If you need a refill on your cardiac medications before your next appointment, please call your pharmacy* ? ? ?Lab Work: ?TSH, BMET today ?If you have labs (blood work) drawn today and your tests are completely normal, you will receive your results only by: ?MyChart Message (if you have MyChart) OR ?A paper copy in the mail ?If you have any lab test that is abnormal or we need to change your treatment, we will call you to review the results. ? ? ?Testing/Procedures: ?NONE ? ? ?Follow-Up: ?At [facility], you and your health needs are our priority.  As part of our continuing mission to provide you with exceptional heart care, we have created designated Provider Care Teams.  These Care Teams include your primary Cardiologist (physician) and Advanced Practice Providers (APPs -  Physician Assistants and Nurse Practitioners) who all work together to provide you with the care you need, when you need it. ? ?We recommend signing up for the patient portal called "MyChart".  Sign up information is provided on this After Visit Summary.  MyChart is used to connect with patients for Virtual Visits (Telemedicine).  Patients are able to view lab/test results, encounter notes, upcoming appointments, etc.  Non-urgent messages can be sent to your provider as well.   ?To learn more about what you can do with MyChart, go to [email or web address].   ? ?Your next appointment:   ?6 month(s) ? ?The format for your next appointment:   ?In Person ? ?Provider:   ?Careers information officer, bhagat, weaver, [place] ? ? ? ?Important Information About Sugar ? ? ? ? ?  ?

## 2021-10-22 ENCOUNTER — Other Ambulatory Visit: Payer: Medicare Other | Admitting: *Deleted

## 2021-10-22 ENCOUNTER — Telehealth: Payer: Self-pay

## 2021-10-22 DIAGNOSIS — E875 Hyperkalemia: Secondary | ICD-10-CM

## 2021-10-22 LAB — BASIC METABOLIC PANEL
BUN/Creatinine Ratio: 25 — ABNORMAL HIGH (ref 9–23)
BUN/Creatinine Ratio: 26 — ABNORMAL HIGH (ref 9–23)
BUN: 22 mg/dL (ref 6–24)
BUN: 23 mg/dL (ref 6–24)
CO2: 24 mmol/L (ref 20–29)
CO2: 25 mmol/L (ref 20–29)
Calcium: 9 mg/dL (ref 8.7–10.2)
Calcium: 9 mg/dL (ref 8.7–10.2)
Chloride: 98 mmol/L (ref 96–106)
Chloride: 102 mmol/L (ref 96–106)
Creatinine, Ser: 0.89 mg/dL (ref 0.57–1.00)
Creatinine, Ser: 0.9 mg/dL (ref 0.57–1.00)
Glucose: 82 mg/dL (ref 70–99)
Glucose: 127 mg/dL — ABNORMAL HIGH (ref 70–99)
Potassium: 5.9 mmol/L (ref 3.5–5.2)
Potassium: 5.6 mmol/L — ABNORMAL HIGH (ref 3.5–5.2)
Sodium: 134 mmol/L (ref 134–144)
Sodium: 136 mmol/L (ref 134–144)
eGFR: 80 mL/min/{1.73_m2} (ref >59)
eGFR: 79 mL/min/{1.73_m2} (ref >59)

## 2021-10-22 LAB — TSH: TSH: 8.27 u[IU]/mL — ABNORMAL HIGH (ref 0.450–4.500)

## 2021-10-22 NOTE — Telephone Encounter (Signed)
-----   Message from Beatrice Lecher, PA-C sent at 10/22/2021  8:17 AM EDT ----- ?Michele Mcalpine this came to me but looks like you ordered it. ?----- Message ----- ?From: Interface, [facility] Lab Results In ?Sent: 10/22/2021   4:36 AM EDT ?To: Beatrice Lecher, PA-C ? ? ?

## 2021-10-22 NOTE — Telephone Encounter (Signed)
cott Moishe Spice, PA-C  ?10/22/2021  8:22 AM EDT Back to Top  ?  ?Results came to me but test ordered by Dr. Elease Hashimoto.  It looks like he is on vacation today.  I have forwarded labs to him to review.  TSH to be addressed by Dr. Elease Hashimoto. ?However, with elevated K+, pt will need f/u today. ?Renal function is normal.  No meds to increase K+ on med list. ?PLAN:  ?-Repeat BMET today (Dx: hyperkalemia) ?-Enter order under Dr. Elease Hashimoto (not me) ?Tereso Newcomer, PA-C    ?10/22/2021 8:19 AM    ? ?Called and spoke with patient who will come in today for repeat BMET to check her K levels. ?

## 2021-10-23 ENCOUNTER — Telehealth: Payer: Self-pay

## 2021-10-23 DIAGNOSIS — E875 Hyperkalemia: Secondary | ICD-10-CM

## 2021-10-23 NOTE — Telephone Encounter (Signed)
Order placed for repeat BMET, lab appt scheduled and pt made aware. ?

## 2021-10-23 NOTE — Telephone Encounter (Signed)
-----   Message from Sampson Goon, RN sent at 10/23/2021  8:07 AM EDT ----- ? ?----- Message ----- ?From: Vesta Mixer, MD ?Sent: 10/23/2021   7:54 AM EDT ?To: [facility] Triage ? ?Her renal function is stable ?Potassium was normal 5 months ago ?This may be due to hemolysis ?Have her reduce the intake of foods that are higher in potassium ( potatoes, oranges, blueberries)  ? ?Recheck in 1-2 weeks ? ? ?

## 2021-11-04 ENCOUNTER — Other Ambulatory Visit: Payer: Medicare Other | Admitting: *Deleted

## 2021-11-04 DIAGNOSIS — E875 Hyperkalemia: Secondary | ICD-10-CM

## 2021-11-05 LAB — BASIC METABOLIC PANEL
BUN/Creatinine Ratio: 21 (ref 9–23)
BUN: 18 mg/dL (ref 6–24)
CO2: 21 mmol/L (ref 20–29)
Calcium: 8.6 mg/dL — ABNORMAL LOW (ref 8.7–10.2)
Chloride: 106 mmol/L (ref 96–106)
Creatinine, Ser: 0.86 mg/dL (ref 0.57–1.00)
Glucose: 65 mg/dL — ABNORMAL LOW (ref 70–99)
Potassium: 5.2 mmol/L (ref 3.5–5.2)
Sodium: 141 mmol/L (ref 134–144)
eGFR: 83 mL/min/{1.73_m2} (ref >59)

## 2021-11-06 ENCOUNTER — Other Ambulatory Visit: Payer: Medicare Other

## 2022-05-13 ENCOUNTER — Other Ambulatory Visit: Payer: Self-pay

## 2022-05-13 DIAGNOSIS — M5440 Lumbago with sciatica, unspecified side: Secondary | ICD-10-CM

## 2022-05-21 ENCOUNTER — Other Ambulatory Visit: Payer: Self-pay | Admitting: Neurosurgery

## 2022-05-21 DIAGNOSIS — M5440 Lumbago with sciatica, unspecified side: Secondary | ICD-10-CM

## 2022-05-28 ENCOUNTER — Ambulatory Visit
Admission: RE | Admit: 2022-05-28 | Discharge: 2022-05-28 | Disposition: A | Discharge status: Home / Self Care | Payer: Medicare Other | Source: Ambulatory Visit | Attending: Neurosurgery | Admitting: Neurosurgery

## 2022-05-28 DIAGNOSIS — M5440 Lumbago with sciatica, unspecified side: Secondary | ICD-10-CM

## 2022-06-11 ENCOUNTER — Telehealth: Payer: Self-pay | Admitting: *Deleted

## 2022-06-11 NOTE — Telephone Encounter (Signed)
   Pre-operative Risk Assessment    Patient Name: Renee Joseph  DOB: 1970/11/10 MRN: [id number]      Request for Surgical Clearance    Procedure:   SPINAL CORD STIMULATOR  Date of Surgery:  Clearance 06/16/22                                 Surgeon:   Surgeon's Group or Practice Name:  [facility] Phone number:  [phone] Fax number:  [phone]   Type of Clearance Requested:   - Medical    Type of Anesthesia:  Spinal   Additional requests/questions:    Wilhemina Cash   06/11/2022, 12:43 PM

## 2022-06-11 NOTE — Telephone Encounter (Signed)
   Name: Renee Joseph  DOB: 1971-03-19  MRN: [id number]  Primary Cardiologist: None   Preoperative team, please contact this patient and set up a phone call appointment for further preoperative risk assessment. Please obtain consent and complete medication review. Thank you for your help.  I confirm that guidance regarding antiplatelet and oral anticoagulation therapy has been completed and, if necessary, noted below.  None requested    Ronney Asters, NP 06/11/2022, 2:22 PM [facility]

## 2022-06-11 NOTE — Telephone Encounter (Signed)
Pt has been scheduled to see Jari Favre, PA-C, 07/08/22, clearance will be addressed at that time.  Will route to the requesting surgeon's office to make them aware.

## 2022-06-11 NOTE — Telephone Encounter (Signed)
            Pre-operative Risk Assessment    Patient Name: Renee Joseph  DOB: 1970-09-28 MRN: [id number]      Request for Surgical Clearance    Procedure:   TOTAL RIGHT KNEE REPLACEMENT  Date of Surgery:  Clearance TBD                                 Surgeon's Group or Practice Name:  [facility] Phone number:  [phone] Fax number:  [phone]   Type of Clearance Requested:   - Medical    Type of Anesthesia:  Spinal   Additional requests/questions:     Wilhemina Cash   06/11/2022, 12:43 PM

## 2022-07-08 ENCOUNTER — Ambulatory Visit: Payer: Managed Care, Other (non HMO) | Attending: Physician Assistant | Admitting: Physician Assistant

## 2022-07-08 ENCOUNTER — Encounter: Payer: Self-pay | Admitting: Physician Assistant

## 2022-07-08 VITALS — BP 118/78 | HR 70 | Ht 64.0 in | Wt 195.8 lb

## 2022-07-08 DIAGNOSIS — I340 Nonrheumatic mitral (valve) insufficiency: Secondary | ICD-10-CM

## 2022-07-08 DIAGNOSIS — I071 Rheumatic tricuspid insufficiency: Secondary | ICD-10-CM | POA: Diagnosis not present

## 2022-07-08 DIAGNOSIS — R002 Palpitations: Secondary | ICD-10-CM | POA: Diagnosis not present

## 2022-07-08 DIAGNOSIS — Z01810 Encounter for preprocedural cardiovascular examination: Secondary | ICD-10-CM

## 2022-07-08 DIAGNOSIS — I2510 Atherosclerotic heart disease of native coronary artery without angina pectoris: Secondary | ICD-10-CM | POA: Diagnosis not present

## 2022-07-08 MED ORDER — METOPROLOL TARTRATE 25 MG PO TABS: 37.5000 mg | ORAL_TABLET | Freq: Two times a day (BID) | ORAL | 3 refills | Status: DC

## 2022-07-08 NOTE — Progress Notes (Signed)
Office Visit    Patient Name: Renee Joseph Date of Encounter: 07/08/2022  PCP:  Vickii Penna, MD   [facility]  Cardiologist:  Mertie Moores, MD  Advanced Practice Provider:  No care team member to display Electrophysiologist:  None   HPI    Renee Joseph is a 52 y.o. female CAD (mild CAD by CT angiogram), DVT, chronic venous hypertension, chronic venous sufficiency, syncope, neuropathy, tricuspid valve regurgitation presents today for preop clearance.  She was seen for the first time in 10/2021 for episode of chest pain, palpitations, and dyspnea.  She had several studies while on [place].  LVEF on echocardiogram was 73%, trivial MR, mild TR.  Carotid duplex showed no significant carotid artery disease.  She has been on Pradaxa for several years for DVT.  She is having more DOE and more palpitations usually at rest.  No regular exercise program.  She had a knee replacement about 8 to 9 months ago and is limited with joint pain.  She has had both hips replaced, spinal surgery, lumbar surgery, laminectomy in her neck.  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  She drinks boost.  Weight was 438 pounds at her heaviest.  She was started on metoprolol 12.5 mg twice daily at her last appointment.  She had an event monitor September 2021 which showed episodes of nonsustained supraventricular tachycardia.  Longest episode was 12 beats with a maximum heart rate of 170s.  She was increased to 25 mg twice a day of metoprolol.  Labs were ordered including BMP and TSH.  Today she, states that she is here for preop clearance. She has had some palpitations, SOB, lightheadedness, and dizziness. No chest pain. She otherwise has been okay. She states the palpitations have been more frequent and occur a few times a week. They last for about 30-40 seconds and then subside on their own. She is very symptomatic with her  palpitations. We have discussed a plan for workup today and will address clearance at her next follow-up visit after testing.  Reports no chest pain, pressure, or tightness. No edema, orthopnea, PND.  Past Medical History    Past Medical History:  Diagnosis Date   CAD (coronary artery disease)    Chronic venous hypertension (idiopathic) with inflammation of right lower extremity    Chronic venous insufficiency    DVT (deep venous thrombosis) (HCC)    Neuropathy    Syncope, vasovagal    Tricuspid valve regurgitation    Valvular heart disease    Past Surgical History:  Procedure Laterality Date   APPENDECTOMY     BACK SURGERY     CARPAL TUNNEL RELEASE     GALLBLADDER SURGERY     KNEE SURGERY Bilateral    LAMINECTOMY     SLEEVE GASTROPLASTY     THYROIDECTOMY     TOTAL HIP ARTHROPLASTY      Allergies  Allergies  Allergen Reactions   Crab Extract Allergy Skin Test Anaphylaxis    Only crab but can eat all other shellfish   Other Itching    All "Cillinis"   Sulfa Antibiotics Anaphylaxis   Apixaban Itching and Hives    Other reaction(s): Confusion (intolerance), Dizziness (intolerance)   Erythromycin Base Hives   Ketorolac Hives   Latex Hives    Powder inside the gloves    Nabumetone Hives   Pedi-Pre Tape Spray [Wound Dressing Adhesive] Hives  Plastic tape   Penicillins Hives   Pregabalin Itching   Rivaroxaban Itching and Hives    Other reaction(s): Confusion (intolerance), Dizziness (intolerance)   Toradol [Ketorolac Tromethamine] Hives and Itching    EKGs/Labs/Other Studies Reviewed:   The following studies were reviewed today:  Lower extremity venous ultrasound 08/12/2021  Summary:  Right:  - Color duplex evaluation of the right lower extremity shows there is  chronic thrombus in the distal femoral vein and popliteal vein.  - Venous reflux is noted in the right common femoral vein.  - Venous reflux is noted in the right greater saphenous vein in the calf.   - Venous reflux is noted in the right popliteal vein.  - Venous reflux is noted in the right perforator vein.    Left:  - No evidence of deep vein thrombosis seen in the left lower extremity,  from the common femoral through the popliteal veins.  - There is no evidence of venous reflux seen in the left lower extremity.   EKG:  EKG is ordered today.  NSR, rate 70 bpm  Recent Labs: 10/21/2021: TSH 8.270 11/04/2021: BUN 18; Creatinine, Ser 0.86; Potassium 5.2; Sodium 141  Recent Lipid Panel No results found for: "CHOL", "TRIG", "HDL", "CHOLHDL", "VLDL", "LDLCALC", "LDLDIRECT"  Home Medications   Current Meds  Medication Sig   ALPRAZolam (XANAX) 0.5 MG tablet Take 0.5 mg by mouth every morning.   cyclobenzaprine (FLEXERIL) 10 MG tablet Take 10 mg by mouth 3 (three) times daily.   dabigatran (PRADAXA) 150 MG CAPS capsule Take 150 mg by mouth 2 (two) times daily.   EPINEPHrine 0.3 mg/0.3 mL IJ SOAJ injection 1 pre-filled pen syringe IM PRN   FEROSUL 325 (65 Fe) MG tablet Take 325 mg by mouth daily.   gabapentin (NEURONTIN) 300 MG capsule Take 600 mg by mouth 3 (three) times daily.   linaclotide (LINZESS) 145 MCG CAPS capsule Take 145 mcg by mouth daily.   meloxicam (MOBIC) 15 MG tablet Take 15 mg by mouth daily.   methocarbamol (ROBAXIN) 750 MG tablet Take 750 mg by mouth 2 (two) times daily.   omeprazole (PRILOSEC) 20 MG capsule Take 20 mg by mouth daily.   ondansetron (ZOFRAN-ODT) 4 MG disintegrating tablet 4mg  ODT q4 hours prn nausea/vomit   pantoprazole (PROTONIX) 40 MG tablet Take 40 mg by mouth daily.   sucralfate (CARAFATE) 1 g tablet Take 1 g by mouth 4 (four) times daily -  with meals and at bedtime.   traMADol (ULTRAM) 50 MG tablet Take 50 mg by mouth every 8 (eight) hours as needed for severe pain.   venlafaxine XR (EFFEXOR-XR) 75 MG 24 hr capsule Take 225 mg by mouth daily.   zolpidem (AMBIEN) 10 MG tablet Take 10 mg by mouth at bedtime as needed.   [DISCONTINUED] metoprolol  tartrate (LOPRESSOR) 25 MG tablet Take 1 tablet (25 mg total) by mouth 2 (two) times daily.     Review of Systems      All other systems reviewed and are otherwise negative except as noted above.  Physical Exam    VS:  BP 118/78   Pulse 70   Ht 5\' 4"  (1.626 m)   Wt 195 lb 12.8 oz (88.8 kg)   SpO2 97%   BMI 33.61 kg/m  , BMI Body mass index is 33.61 kg/m.  Wt Readings from Last 3 Encounters:  07/08/22 195 lb 12.8 oz (88.8 kg)  10/21/21 213 lb 9.6 oz (96.9 kg)  08/12/21 207 lb (93.9  kg)     GEN: Well nourished, well developed, in no acute distress. HEENT: normal. Neck: Supple, no JVD, carotid bruits, or masses. Cardiac: RRR, no murmurs, rubs, or gallops. No clubbing, cyanosis, edema.  Radials/PT 2+ and equal bilaterally.  Respiratory:  Respirations regular and unlabored, clear to auscultation bilaterally. GI: Soft, nontender, nondistended. MS: No deformity or atrophy. Skin: Warm and dry, no rash. Neuro:  Strength and sensation are intact. Psych: Normal affect.  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  Signed, Sharlene Dory, PA-C 07/08/2022, 4:17 PM [facility]

## 2022-07-08 NOTE — Patient Instructions (Addendum)
Medication Instructions:  1.Increase metoprolol tartrate (Lopressor) to 37.5 mg twice a day. This will be one and one-half of the 25 mg tablet twice daily. *If you need a refill on your cardiac medications before your next appointment, please call your pharmacy*   Lab Work: BMP, CBC, TSH and Mag today If you have labs (blood work) drawn today and your tests are completely normal, you will receive your results only by: [place] (if you have MyChart) OR A paper copy in the mail If you have any lab test that is abnormal or we need to change your treatment, we will call you to review the results.   Testing/Procedures: Your physician has requested that you have an echocardiogram within one week. Echocardiography is a painless test that uses sound waves to create images of your heart. It provides your doctor with information about the size and shape of your heart and how well your heart's chambers and valves are working. This procedure takes approximately one hour. There are no restrictions for this procedure. Please do NOT wear cologne, perfume, aftershave, or lotions (deodorant is allowed). Please arrive 15 minutes prior to your appointment time.   ZIO XT- Long Term Monitor Instructions  Your physician has requested you wear a ZIO patch monitor for 14 days.  This is a single patch monitor. [employer] supplies one patch monitor per enrollment. Additional stickers are not available. Please do not apply patch if you will be having a Nuclear Stress Test,  Echocardiogram, Cardiac CT, MRI, or Chest Xray during the period you would be wearing the  monitor. The patch cannot be worn during these tests. You cannot remove and re-apply the  ZIO XT patch monitor.  Your ZIO patch monitor will be mailed 3 day USPS to your address on file. It may take 3-5 days  to receive your monitor after you have been enrolled.  Once you have received your monitor, please review the enclosed instructions. Your monitor   has already been registered assigning a specific monitor serial # to you.  Billing and Patient Assistance Program Information  We have supplied [employer] with any of your insurance information on file for billing purposes. [employer] offers a sliding scale Patient Assistance Program for patients that do not have  insurance, or whose insurance does not completely cover the cost of the ZIO monitor.  You must apply for the Patient Assistance Program to qualify for this discounted rate.  To apply, please call [employer] at [phone], select option 4, select option 2, ask to apply for  Patient Assistance Program. Theodore Demark will ask your household income, and how many people  are in your household. They will quote your out-of-pocket cost based on that information.  [employer] will also be able to set up a 62-month, interest-free payment plan if needed.  Applying the monitor   Shave hair from upper left chest.  Hold abrader disc by orange tab. Rub abrader in 40 strokes over the upper left chest as  indicated in your monitor instructions.  Clean area with 4 enclosed alcohol pads. Let dry.  Apply patch as indicated in monitor instructions. Patch will be placed under collarbone on left  side of chest with arrow pointing upward.  Rub patch adhesive wings for 2 minutes. Remove white label marked "1". Remove the white  label marked "2". Rub patch adhesive wings for 2 additional minutes.  While looking in a mirror, press and release button in center of patch. A small green light will  flash 3-4  times. This will be your only indicator that the monitor has been turned on.  Do not shower for the first 24 hours. You may shower after the first 24 hours.  Press the button if you feel a symptom. You will hear a small click. Record Date, Time and  Symptom in the Patient Logbook.  When you are ready to remove the patch, follow instructions on the last 2 pages of Patient  Logbook. Stick patch monitor onto the last page  of Patient Logbook.  Place Patient Logbook in the blue and white box. Use locking tab on box and tape box closed  securely. The blue and white box has prepaid postage on it. Please place it in the mailbox as  soon as possible. Your physician should have your test results approximately 7 days after the  monitor has been mailed back to [facility]  Call [place] at [phone] if you have questions regarding  your ZIO XT patch monitor. Call them immediately if you see an orange light blinking on your  monitor.  If your monitor falls off in less than 4 days, contact our Monitor department at [phone].  If your monitor becomes loose or falls off after 4 days call [employer] at [phone] for  suggestions on securing your monitor    Follow-Up: At [facility], you and your health needs are our priority.  As part of our continuing mission to provide you with exceptional heart care, we have created designated Provider Care Teams.  These Care Teams include your primary Cardiologist (physician) and Advanced Practice Providers (APPs -  Physician Assistants and Nurse Practitioners) who all work together to provide you with the care you need, when you need it.   Your next appointment:   1 month(s)  Provider:   Mertie Moores, MD  or APP

## 2022-07-09 ENCOUNTER — Ambulatory Visit (INDEPENDENT_AMBULATORY_CARE_PROVIDER_SITE_OTHER): Payer: Managed Care, Other (non HMO)

## 2022-07-09 DIAGNOSIS — R002 Palpitations: Secondary | ICD-10-CM | POA: Diagnosis not present

## 2022-07-09 DIAGNOSIS — R069 Unspecified abnormalities of breathing: Secondary | ICD-10-CM

## 2022-07-09 DIAGNOSIS — R600 Localized edema: Secondary | ICD-10-CM

## 2022-07-09 LAB — ECHOCARDIOGRAM COMPLETE
AR max vel: 2.73 cm2
AV Area VTI: 2.4 cm2
AV Area mean vel: 2.49 cm2
AV Mean grad: 3 mmHg
AV Peak grad: 5.9 mmHg
Ao pk vel: 1.21 m/s
Area-P 1/2: 3.51 cm2
Calc EF: 66.3 %
S' Lateral: 2.94 cm
Single Plane A2C EF: 66.6 %
Single Plane A4C EF: 67.3 %

## 2022-07-09 LAB — BASIC METABOLIC PANEL
BUN/Creatinine Ratio: 11 (ref 9–23)
BUN: 9 mg/dL (ref 6–24)
CO2: 21 mmol/L (ref 20–29)
Calcium: 8.7 mg/dL (ref 8.7–10.2)
Chloride: 109 mmol/L — ABNORMAL HIGH (ref 96–106)
Creatinine, Ser: 0.81 mg/dL (ref 0.57–1.00)
Glucose: 95 mg/dL (ref 70–99)
Potassium: 4.4 mmol/L (ref 3.5–5.2)
Sodium: 144 mmol/L (ref 134–144)
eGFR: 89 mL/min/{1.73_m2} (ref >59)

## 2022-07-09 LAB — CBC
Hematocrit: 44.6 % (ref 34.0–46.6)
Hemoglobin: 14.2 g/dL (ref 11.1–15.9)
MCH: 28.5 pg (ref 26.6–33.0)
MCHC: 31.8 g/dL (ref 31.5–35.7)
MCV: 89 fL (ref 79–97)
Platelets: 194 10*3/uL (ref 150–450)
RBC: 4.99 x10E6/uL (ref 3.77–5.28)
RDW: 13.1 % (ref 11.7–15.4)
WBC: 4.4 10*3/uL (ref 3.4–10.8)

## 2022-07-09 LAB — TSH: TSH: 3.21 u[IU]/mL (ref 0.450–4.500)

## 2022-07-09 LAB — MAGNESIUM: Magnesium: 2.3 mg/dL (ref 1.6–2.3)

## 2022-07-12 DIAGNOSIS — I071 Rheumatic tricuspid insufficiency: Secondary | ICD-10-CM

## 2022-07-16 DIAGNOSIS — I071 Rheumatic tricuspid insufficiency: Secondary | ICD-10-CM | POA: Insufficient documentation

## 2022-07-16 NOTE — Telephone Encounter (Signed)
Ms. Renee Joseph,   Chloride is an electrolyte and by itself in a lab setting we do not pay too much attention to it, especially if no other symptoms are present. It could be a sign of some dehydration at the time of your labs. It is certainly something we can recheck if you are concerned about it.   We can add tricuspid valve regurgitation.   Have a good day!   Elgie Collard, PA-C

## 2022-07-27 ENCOUNTER — Ambulatory Visit: Payer: Managed Care, Other (non HMO) | Attending: Physician Assistant

## 2022-07-27 DIAGNOSIS — R002 Palpitations: Secondary | ICD-10-CM

## 2022-07-27 NOTE — Progress Notes (Unsigned)
Enrolled for [employer] to mail a ZIO XT long term holter monitor to the patients address on file on 07/27/22. Order had been entered 07/08/22 for monitor to be processed by [facility] office in error.  Order corrected 07/27/22.  Dr. Acie Fredrickson to read.

## 2022-07-30 DIAGNOSIS — R002 Palpitations: Secondary | ICD-10-CM

## 2022-08-06 ENCOUNTER — Ambulatory Visit: Payer: Managed Care, Other (non HMO) | Admitting: Podiatry

## 2022-08-10 ENCOUNTER — Ambulatory Visit: Payer: Managed Care, Other (non HMO) | Admitting: Cardiovascular Disease

## 2022-08-11 ENCOUNTER — Ambulatory Visit (INDEPENDENT_AMBULATORY_CARE_PROVIDER_SITE_OTHER): Payer: Managed Care, Other (non HMO)

## 2022-08-11 ENCOUNTER — Other Ambulatory Visit: Payer: Self-pay | Admitting: Podiatry

## 2022-08-11 ENCOUNTER — Ambulatory Visit: Payer: Managed Care, Other (non HMO) | Admitting: Podiatry

## 2022-08-11 DIAGNOSIS — M722 Plantar fascial fibromatosis: Secondary | ICD-10-CM

## 2022-08-11 DIAGNOSIS — M79672 Pain in left foot: Secondary | ICD-10-CM

## 2022-08-11 NOTE — Progress Notes (Signed)
Subjective:  Patient ID: Renee Joseph, female    DOB: 07-01-1970,  MRN: [id number]  Chief Complaint  Patient presents with   Foot Pain    Bilateral  foot pain Pt stated that it feels like she is walking on bone she has pain on the top of her feet and in her toes     52 y.o. female presents with the above complaint.  Patient presents with complaint bilateral heel pain that has been on for quite some time is progressive gotten worse.  She states that it feels like walking on the bone.  There is bruising and pain on the bottom of the foot she has not seen anyone as prior to seeing me she would like to discuss treatment options for this.  Hurts with ambulation worse with pressure.   Review of Systems: Negative except as noted in the HPI. Denies N/V/F/Ch.  Past Medical History:  Diagnosis Date   CAD (coronary artery disease)    Chronic venous hypertension (idiopathic) with inflammation of right lower extremity    Chronic venous insufficiency    DVT (deep venous thrombosis) (HCC)    Neuropathy    Syncope, vasovagal    Tricuspid valve regurgitation    Valvular heart disease     Current Outpatient Medications:    meloxicam (MOBIC) 15 MG tablet, Take 1 tablet (15 mg total) by mouth daily., Disp: 30 tablet, Rfl: 0   methylPREDNISolone (MEDROL DOSEPAK) 4 MG TBPK tablet, Take as directed, Disp: 21 each, Rfl: 0   ALPRAZolam (XANAX) 0.5 MG tablet, Take 0.5 mg by mouth every morning., Disp: , Rfl:    cyclobenzaprine (FLEXERIL) 10 MG tablet, Take 10 mg by mouth 3 (three) times daily., Disp: , Rfl:    dabigatran (PRADAXA) 150 MG CAPS capsule, Take 150 mg by mouth 2 (two) times daily., Disp: , Rfl:    EPINEPHrine 0.3 mg/0.3 mL IJ SOAJ injection, 1 pre-filled pen syringe IM PRN, Disp: , Rfl:    FEROSUL 325 (65 Fe) MG tablet, Take 325 mg by mouth daily., Disp: , Rfl:    gabapentin (NEURONTIN) 300 MG capsule, Take 600 mg by mouth 3 (three) times daily., Disp: , Rfl:    linaclotide  (LINZESS) 145 MCG CAPS capsule, Take 145 mcg by mouth daily., Disp: , Rfl:    meloxicam (MOBIC) 15 MG tablet, Take 15 mg by mouth daily., Disp: , Rfl:    methocarbamol (ROBAXIN) 750 MG tablet, Take 750 mg by mouth 2 (two) times daily., Disp: , Rfl:    metoprolol tartrate (LOPRESSOR) 25 MG tablet, Take 1.5 tablets (37.5 mg total) by mouth 2 (two) times daily., Disp: 270 tablet, Rfl: 3   omeprazole (PRILOSEC) 20 MG capsule, Take 20 mg by mouth daily., Disp: , Rfl:    ondansetron (ZOFRAN-ODT) 4 MG disintegrating tablet, '4mg'$  ODT q4 hours prn nausea/vomit, Disp: 20 tablet, Rfl: 0   pantoprazole (PROTONIX) 40 MG tablet, Take 40 mg by mouth daily., Disp: , Rfl:    sucralfate (CARAFATE) 1 g tablet, Take 1 g by mouth 4 (four) times daily -  with meals and at bedtime., Disp: , Rfl:    traMADol (ULTRAM) 50 MG tablet, Take 50 mg by mouth every 8 (eight) hours as needed for severe pain., Disp: , Rfl:    venlafaxine XR (EFFEXOR-XR) 75 MG 24 hr capsule, Take 225 mg by mouth daily., Disp: , Rfl:    zolpidem (AMBIEN) 10 MG tablet, Take 10 mg by mouth at bedtime as needed., Disp: ,  Rfl:   Social History   Tobacco Use  Smoking Status Never  Smokeless Tobacco Never    Allergies  Allergen Reactions   Crab Extract Allergy Skin Test Anaphylaxis    Only crab but can eat all other shellfish   Other Itching    All "Cillinis"   Sulfa Antibiotics Anaphylaxis   Apixaban Itching and Hives    Other reaction(s): Confusion (intolerance), Dizziness (intolerance)   Erythromycin Base Hives   Ketorolac Hives   Latex Hives    Powder inside the gloves    Nabumetone Hives   Pedi-Pre Tape Spray [Wound Dressing Adhesive] Hives    Plastic tape   Penicillins Hives   Pregabalin Itching   Rivaroxaban Itching and Hives    Other reaction(s): Confusion (intolerance), Dizziness (intolerance)   Toradol [Ketorolac Tromethamine] Hives and Itching   Objective:  There were no vitals filed for this visit. There is no height or  weight on file to calculate BMI. Constitutional Well developed. Well nourished.  Vascular Dorsalis pedis pulses palpable bilaterally. Posterior tibial pulses palpable bilaterally. Capillary refill normal to all digits.  No cyanosis or clubbing noted. Pedal hair growth normal.  Neurologic Normal speech. Oriented to person, place, and time. Epicritic sensation to light touch grossly present bilaterally.  Dermatologic Nails well groomed and normal in appearance. No open wounds. No skin lesions.  Orthopedic: Normal joint ROM without pain or crepitus bilaterally. No visible deformities. Tender to palpation at the calcaneal tuber bilaterally. No pain with calcaneal squeeze bilaterally. Ankle ROM diminished range of motion bilaterally. Silf[place]kiold Test: positive bilaterally.   Radiographs: 3 views of skeletally mature the bilateral foot: Plantar no Planter of posterior spurring noted.  No abnormalities noted.  Submet adductus present  Assessment:   1. Plantar fasciitis of right foot   2. Plantar fasciitis of left foot    Plan:  Patient was evaluated and treated and all questions answered.  Plantar Fasciitis, bilaterally - XR reviewed as above.  - Educated on icing and stretching. Instructions given.  - Injection delivered to the plantar fascia as below. - DME: Plantar fascial brace dispensed to support the medial longitudinal arch of the foot and offload pressure from the heel and prevent arch collapse during weightbearing - Pharmacologic management: None  Procedure: Injection Tendon/Ligament Location: Bilateral plantar fascia at the glabrous junction; medial approach. Skin Prep: alcohol Injectate: 0.5 cc 0.5% marcaine plain, 0.5 cc of 1% Lidocaine, 0.5 cc kenalog 10. Disposition: Patient tolerated procedure well. Injection site dressed with a band-aid.  No follow-ups on file.

## 2022-08-17 ENCOUNTER — Encounter: Payer: Self-pay | Admitting: Podiatry

## 2022-08-18 ENCOUNTER — Other Ambulatory Visit: Payer: Self-pay | Admitting: Podiatry

## 2022-08-18 MED ORDER — MELOXICAM 15 MG PO TABS: 15.0000 mg | ORAL_TABLET | Freq: Every day | ORAL | 0 refills | Status: DC

## 2022-08-18 MED ORDER — METHYLPREDNISOLONE 4 MG PO TBPK: ORAL_TABLET | ORAL | 0 refills | Status: DC

## 2022-09-07 ENCOUNTER — Encounter: Payer: Self-pay | Admitting: Cardiovascular Disease

## 2022-09-07 NOTE — Progress Notes (Signed)
This encounter was created in error - please disregard.

## 2022-09-08 ENCOUNTER — Ambulatory Visit: Payer: Managed Care, Other (non HMO) | Admitting: Cardiovascular Disease

## 2022-09-09 ENCOUNTER — Ambulatory Visit: Payer: Managed Care, Other (non HMO) | Admitting: Podiatry

## 2022-09-09 ENCOUNTER — Other Ambulatory Visit: Payer: Self-pay | Admitting: Podiatry

## 2022-09-22 ENCOUNTER — Other Ambulatory Visit: Payer: Self-pay | Admitting: Podiatry

## 2022-09-22 MED ORDER — METHYLPREDNISOLONE 4 MG PO TBPK: ORAL_TABLET | ORAL | 0 refills | Status: DC

## 2022-09-30 ENCOUNTER — Other Ambulatory Visit: Payer: Self-pay | Admitting: Cardiovascular Disease

## 2022-09-30 DIAGNOSIS — R002 Palpitations: Secondary | ICD-10-CM

## 2022-10-13 ENCOUNTER — Ambulatory Visit (INDEPENDENT_AMBULATORY_CARE_PROVIDER_SITE_OTHER): Payer: Managed Care, Other (non HMO) | Admitting: Podiatry

## 2022-10-13 DIAGNOSIS — M722 Plantar fascial fibromatosis: Secondary | ICD-10-CM | POA: Diagnosis not present

## 2022-10-13 DIAGNOSIS — Q666 Other congenital valgus deformities of feet: Secondary | ICD-10-CM | POA: Diagnosis not present

## 2022-10-13 NOTE — Progress Notes (Signed)
Subjective:  Patient ID: Renee Joseph, female    DOB: 1971/06/21,  MRN: [id number]  Chief Complaint  Patient presents with   Plantar Fasciitis    52 y.o. female presents with the above complaint.  Patient presents for follow-up of bilateral Planter fasciitis.  The injection did not help.  The right side is worse than left side.  She denies any other acute complaints.  Hurts with ambulation hurts with pressure she would like to discuss next treatment plan   Review of Systems: Negative except as noted in the HPI. Denies N/V/F/Ch.  Past Medical History:  Diagnosis Date   CAD (coronary artery disease)    Chronic venous hypertension (idiopathic) with inflammation of right lower extremity    Chronic venous insufficiency    DVT (deep venous thrombosis) (HCC)    Neuropathy    Syncope, vasovagal    Tricuspid valve regurgitation    Valvular heart disease     Current Outpatient Medications:    ALPRAZolam (XANAX) 0.5 MG tablet, Take 0.5 mg by mouth every morning., Disp: , Rfl:    cyclobenzaprine (FLEXERIL) 10 MG tablet, Take 10 mg by mouth 3 (three) times daily., Disp: , Rfl:    dabigatran (PRADAXA) 150 MG CAPS capsule, Take 150 mg by mouth 2 (two) times daily., Disp: , Rfl:    EPINEPHrine 0.3 mg/0.3 mL IJ SOAJ injection, 1 pre-filled pen syringe IM PRN, Disp: , Rfl:    FEROSUL 325 (65 Fe) MG tablet, Take 325 mg by mouth daily., Disp: , Rfl:    gabapentin (NEURONTIN) 300 MG capsule, Take 600 mg by mouth 3 (three) times daily., Disp: , Rfl:    linaclotide (LINZESS) 145 MCG CAPS capsule, Take 145 mcg by mouth daily., Disp: , Rfl:    meloxicam (MOBIC) 15 MG tablet, Take 15 mg by mouth daily., Disp: , Rfl:    meloxicam (MOBIC) 15 MG tablet, Take 1 tablet (15 mg total) by mouth daily., Disp: 30 tablet, Rfl: 0   methocarbamol (ROBAXIN) 750 MG tablet, Take 750 mg by mouth 2 (two) times daily., Disp: , Rfl:    methylPREDNISolone (MEDROL DOSEPAK) 4 MG TBPK tablet, TAKE AS DIRECTED, Disp:  21 tablet, Rfl: 0   metoprolol tartrate (LOPRESSOR) 25 MG tablet, Take 1.5 tablets (37.5 mg total) by mouth 2 (two) times daily., Disp: 270 tablet, Rfl: 3   omeprazole (PRILOSEC) 20 MG capsule, Take 20 mg by mouth daily., Disp: , Rfl:    ondansetron (ZOFRAN-ODT) 4 MG disintegrating tablet, 4mg  ODT q4 hours prn nausea/vomit, Disp: 20 tablet, Rfl: 0   pantoprazole (PROTONIX) 40 MG tablet, Take 40 mg by mouth daily., Disp: , Rfl:    sucralfate (CARAFATE) 1 g tablet, Take 1 g by mouth 4 (four) times daily -  with meals and at bedtime., Disp: , Rfl:    traMADol (ULTRAM) 50 MG tablet, Take 50 mg by mouth every 8 (eight) hours as needed for severe pain., Disp: , Rfl:    venlafaxine XR (EFFEXOR-XR) 75 MG 24 hr capsule, Take 225 mg by mouth daily., Disp: , Rfl:    zolpidem (AMBIEN) 10 MG tablet, Take 10 mg by mouth at bedtime as needed., Disp: , Rfl:   Social History   Tobacco Use  Smoking Status Never  Smokeless Tobacco Never    Allergies  Allergen Reactions   Crab Extract Anaphylaxis    Only crab but can eat all other shellfish   Other Itching    All "Cillinis"   Sulfa Antibiotics Anaphylaxis  Apixaban Itching and Hives    Other reaction(s): Confusion (intolerance), Dizziness (intolerance)   Erythromycin Base Hives   Ketorolac Hives   Latex Hives    Powder inside the gloves    Nabumetone Hives   Pedi-Pre Tape Spray [Wound Dressing Adhesive] Hives    Plastic tape   Penicillins Hives   Pregabalin Itching   Rivaroxaban Itching and Hives    Other reaction(s): Confusion (intolerance), Dizziness (intolerance)   Toradol [Ketorolac Tromethamine] Hives and Itching   Objective:  There were no vitals filed for this visit. There is no height or weight on file to calculate BMI. Constitutional Well developed. Well nourished.  Vascular Dorsalis pedis pulses palpable bilaterally. Posterior tibial pulses palpable bilaterally. Capillary refill normal to all digits.  No cyanosis or clubbing  noted. Pedal hair growth normal.  Neurologic Normal speech. Oriented to person, place, and time. Epicritic sensation to light touch grossly present bilaterally.  Dermatologic Nails well groomed and normal in appearance. No open wounds. No skin lesions.  Orthopedic: Normal joint ROM without pain or crepitus bilaterally. No visible deformities. Tender to palpation at the calcaneal tuber bilaterally. No pain with calcaneal squeeze bilaterally. Ankle ROM diminished range of motion bilaterally. Silfverskiold Test: positive bilaterally.   Radiographs: 3 views of skeletally mature the bilateral foot: Plantar no Planter of posterior spurring noted.  No abnormalities noted.  Submet adductus present  Assessment:   1. Plantar fasciitis of right foot   2. Plantar fasciitis of left foot   3. Pes planovalgus     Plan:  Patient was evaluated and treated and all questions answered.  Plantar Fasciitis, bilaterally right side is worse than left side - XR reviewed as above.  - Educated on icing and stretching. Instructions given.  -No further injection delivered to the plantar fascia as below. - DME: Cam boot immobilization to the right side - Pharmacologic management: None  Pes planovalgus -I explained to patient the etiology of pes planovalgus and relationship with Planter fasciitis and various treatment options were discussed.  Given patient foot structure in the setting of Planter fasciitis I believe patient will benefit from custom-made orthotics to help control the hindfoot motion support the arch of the foot and take the stress away from plantar fascial.  Patient agrees with the plan like to proceed with orthotics -Patient was casted for orthotics   No follow-ups on file.  Right PF worse than left CAM boot injection didn't help  Pesplanovalgus orthtotics

## 2022-10-19 ENCOUNTER — Telehealth: Payer: Self-pay

## 2022-10-19 ENCOUNTER — Telehealth: Payer: Self-pay | Admitting: Cardiovascular Disease

## 2022-10-19 NOTE — Telephone Encounter (Signed)
   Pre-operative Risk Assessment    Patient Name: Renee Joseph  DOB: 1971-04-25 MRN: [id number]      Request for Surgical Clearance    Procedure:   Revision left carpule tunnel release hypothenar fat pad flap  Date of Surgery:  Clearance 11/10/22                                 Surgeon:  Dr. Kerry Fort Surgeon's Group or Practice Name:  [facility] Phone number:  [phone] Fax number:  [phone]   Type of Clearance Requested:   - Medical  - Pharmacy:  Hold TBD  by cardiology    Type of Anesthesia:  MAC   Additional requests/questions:    Minna Antis   10/19/2022, 3:58 PM

## 2022-10-19 NOTE — Telephone Encounter (Signed)
   Name: Renee Joseph  DOB: 08/03/1970  MRN: [id number]  Primary Cardiologist: Kristeen Miss, MD   Preoperative team, please contact this patient and set up a phone call appointment for further preoperative risk assessment. Please obtain consent and complete medication review. Thank you for your help.  I confirm that guidance regarding antiplatelet and oral anticoagulation therapy has been completed and, if necessary, noted below.  Patient is on Pradaxa for history of DVT.  Guidance for holding this medication should come from prescribing provider.   Napoleon Form, Leodis Rains, NP 10/19/2022, 4:09 PM [facility]

## 2022-10-19 NOTE — Telephone Encounter (Signed)
  Patient Consent for Virtual Visit        Renee Joseph has provided verbal consent on 10/19/2022 for a virtual visit (video or telephone).   CONSENT FOR VIRTUAL VISIT FOR:  Renee Joseph  By participating in this virtual visit I agree to the following:  I hereby voluntarily request, consent and authorize [facility] and its employed or contracted physicians, physician assistants, nurse practitioners or other licensed health care professionals (the Practitioner), to provide me with telemedicine health care services (the "Services") as deemed necessary by the treating Practitioner. I acknowledge and consent to receive the Services by the Practitioner via telemedicine. I understand that the telemedicine visit will involve communicating with the Practitioner through live audiovisual communication technology and the disclosure of certain medical information by electronic transmission. I acknowledge that I have been given the opportunity to request an in-person assessment or other available alternative prior to the telemedicine visit and am voluntarily participating in the telemedicine visit.  I understand that I have the right to withhold or withdraw my consent to the use of telemedicine in the course of my care at any time, without affecting my right to future care or treatment, and that the Practitioner or I may terminate the telemedicine visit at any time. I understand that I have the right to inspect all information obtained and/or recorded in the course of the telemedicine visit and may receive copies of available information for a reasonable fee.  I understand that some of the potential risks of receiving the Services via telemedicine include:  Delay or interruption in medical evaluation due to technological equipment failure or disruption; Information transmitted may not be sufficient (e.g. poor resolution of images) to allow for appropriate medical decision making  by the Practitioner; and/or  In rare instances, security protocols could fail, causing a breach of personal health information.  Furthermore, I acknowledge that it is my responsibility to provide information about my medical history, conditions and care that is complete and accurate to the best of my ability. I acknowledge that Practitioner's advice, recommendations, and/or decision may be based on factors not within their control, such as incomplete or inaccurate data provided by me or distortions of diagnostic images or specimens that may result from electronic transmissions. I understand that the practice of medicine is not an exact science and that Practitioner makes no warranties or guarantees regarding treatment outcomes. I acknowledge that a copy of this consent can be made available to me via my patient portal [facility] MyChart), or I can request a printed copy by calling the office of [facility].    I understand that my insurance will be billed for this visit.   I have read or had this consent read to me. I understand the contents of this consent, which adequately explains the benefits and risks of the Services being provided via telemedicine.  I have been provided ample opportunity to ask questions regarding this consent and the Services and have had my questions answered to my satisfaction. I give my informed consent for the services to be provided through the use of telemedicine in my medical care

## 2022-10-19 NOTE — Telephone Encounter (Signed)
Spoke with patient who is agreeable to do a tele visit on 5/9 at 2 pm. Med rec and consent done.

## 2022-10-19 NOTE — Telephone Encounter (Signed)
I have attempted to contact this patient by phone to schedule a tele visit but no answer. Will try back later.

## 2022-10-20 ENCOUNTER — Other Ambulatory Visit: Payer: Self-pay | Admitting: Podiatry

## 2022-10-22 ENCOUNTER — Encounter: Payer: Self-pay | Admitting: Podiatry

## 2022-10-29 ENCOUNTER — Ambulatory Visit: Payer: Managed Care, Other (non HMO) | Attending: Internal Medicine

## 2022-10-29 DIAGNOSIS — Z01810 Encounter for preprocedural cardiovascular examination: Secondary | ICD-10-CM

## 2022-10-29 NOTE — Progress Notes (Signed)
Virtual Visit via Telephone Note   Because of Renee Joseph's co-morbid illnesses, she is at least at moderate risk for complications without adequate follow up.  This format is felt to be most appropriate for this patient at this time.  The patient did not have access to video technology/had technical difficulties with video requiring transitioning to audio format only (telephone).  All issues noted in this document were discussed and addressed.  No physical exam could be performed with this format.  Please refer to the patient's chart for her consent to telehealth for [facility].  Evaluation Performed:  Preoperative cardiovascular risk assessment _____________   Date:  10/29/2022   Patient ID:  Renee Joseph, DOB 1970/12/08, MRN [id number] Patient Location:  Home Provider location:   Office  Primary Care Provider:  Arlan Organ, MD Primary Cardiologist:  Kristeen Miss, MD  Chief Complaint / Patient Profile   52 y.o. y/o female with a h/o CAD (mild CAD by CT angio), DVT, chronic venous hypertension, chronic venous insufficiency, syncope, neuropathy, tricuspid valve regurgitation who is pending revision of left carpal tunnel release, hypothenar fat pad flap and presents today for telephonic preoperative cardiovascular risk assessment.  History of Present Illness    Renee Joseph is a 52 y.o. female who presents via audio/video conferencing for a telehealth visit today.  Pt was last seen in cardiology clinic on 07/08/2022 by myself.  At that time Renee Joseph was doing well.  The patient is now pending procedure as outlined above. Since her last visit, she tells me that her medications were increased and her palpitations are better.  She has been dealing with swelling but this is chronic.  Nothing new.  She is on Pradaxa for history of chronic DVTs.  Guidance will come from prescribing physician.  (She tells me that her PCP told her  to hold it for 4 days).  Her activity is limited by chronic back, hip, knee pain.  She does meet 4 METS.    Past Medical History    Past Medical History:  Diagnosis Date   CAD (coronary artery disease)    Chronic venous hypertension (idiopathic) with inflammation of right lower extremity    Chronic venous insufficiency    DVT (deep venous thrombosis) (HCC)    Neuropathy    Syncope, vasovagal    Tricuspid valve regurgitation    Valvular heart disease    Past Surgical History:  Procedure Laterality Date   APPENDECTOMY     BACK SURGERY     CARPAL TUNNEL RELEASE     GALLBLADDER SURGERY     KNEE SURGERY Bilateral    LAMINECTOMY     SLEEVE GASTROPLASTY     THYROIDECTOMY     TOTAL HIP ARTHROPLASTY      Allergies  Allergies  Allergen Reactions   Crab Extract Anaphylaxis    Only crab but can eat all other shellfish   Other Itching    All "Cillinis"   Sulfa Antibiotics Anaphylaxis   Apixaban Itching and Hives    Other reaction(s): Confusion (intolerance), Dizziness (intolerance)   Erythromycin Base Hives   Ketorolac Hives   Latex Hives    Powder inside the gloves    Nabumetone Hives   Pedi-Pre Tape Spray [Wound Dressing Adhesive] Hives    Plastic tape   Penicillins Hives   Pregabalin Itching   Rivaroxaban Itching and Hives    Other reaction(s): Confusion (intolerance), Dizziness (intolerance)   Toradol [  Ketorolac Tromethamine] Hives and Itching    Home Medications    Prior to Admission medications   Medication Sig Start Date End Date Taking? Authorizing Provider  ALPRAZolam Prudy Feeler) 0.5 MG tablet Take 0.5 mg by mouth every morning. 06/03/21   [provider]  cyclobenzaprine (FLEXERIL) 10 MG tablet Take 10 mg by mouth 3 (three) times daily. 10/20/21   [provider]  dabigatran (PRADAXA) 150 MG CAPS capsule Take 150 mg by mouth 2 (two) times daily.    [provider]  EPINEPHrine 0.3 mg/0.3 mL IJ SOAJ injection 1 pre-filled pen syringe IM  PRN 10/15/21   [provider]  FEROSUL 325 (65 Fe) MG tablet Take 325 mg by mouth daily. 10/10/21   [provider]  gabapentin (NEURONTIN) 300 MG capsule Take 600 mg by mouth 3 (three) times daily. 10/14/21   [provider]  linaclotide (LINZESS) 145 MCG CAPS capsule Take 145 mcg by mouth daily.    [provider]  meloxicam (MOBIC) 15 MG tablet Take 15 mg by mouth daily. 03/19/22   [provider]  meloxicam (MOBIC) 15 MG tablet Take 1 tablet (15 mg total) by mouth daily. 08/18/22   Candelaria Stagers, DPM  methocarbamol (ROBAXIN) 750 MG tablet Take 750 mg by mouth 2 (two) times daily.    [provider]  methylPREDNISolone (MEDROL DOSEPAK) 4 MG TBPK tablet TAKE AS DIRECTED 10/20/22   Standiford, Jenelle Mages, DPM  metoprolol tartrate (LOPRESSOR) 25 MG tablet Take 1.5 tablets (37.5 mg total) by mouth 2 (two) times daily. 07/08/22   Sharlene Dory, PA-C  omeprazole (PRILOSEC) 20 MG capsule Take 20 mg by mouth daily.    [provider]  ondansetron (ZOFRAN-ODT) 4 MG disintegrating tablet 4mg  ODT q4 hours prn nausea/vomit 05/23/21   Melene Plan, DO  pantoprazole (PROTONIX) 40 MG tablet Take 40 mg by mouth daily. 05/05/21   [provider]  sucralfate (CARAFATE) 1 g tablet Take 1 g by mouth 4 (four) times daily -  with meals and at bedtime.    [provider]  traMADol (ULTRAM) 50 MG tablet Take 50 mg by mouth every 8 (eight) hours as needed for severe pain. 06/09/22   [provider]  venlafaxine XR (EFFEXOR-XR) 75 MG 24 hr capsule Take 225 mg by mouth daily. 10/15/21   [provider]  zolpidem (AMBIEN) 10 MG tablet Take 10 mg by mouth at bedtime as needed. 06/05/21   [provider]    Physical Exam    Vital Signs:  Renee Joseph does not have vital signs available for review today.  Given telephonic nature of communication, physical exam is limited. AAOx3. NAD. Normal affect.  Speech and  respirations are unlabored.  Accessory Clinical Findings    None  Assessment & Plan    1.  Preoperative Cardiovascular Risk Assessment:  Ms. Renee Joseph perioperative risk of a major cardiac event is 0.9% according to the Revised Cardiac Risk Index (RCRI).  Therefore, she is at low risk for perioperative complications.   Her functional capacity is fair at 4.4 METs according to the Duke Activity Status Index (DASI). Recommendations: According to ACC/AHA guidelines, no further cardiovascular testing needed.  The patient may proceed to surgery at acceptable risk.    A copy of this note will be routed to requesting surgeon.  Time:   Today, I have spent 5 minutes with the patient with telehealth technology discussing medical history, symptoms, and management plan.  Sharlene Dory, PA-C  10/29/2022, 2:04 PM

## 2022-11-09 ENCOUNTER — Other Ambulatory Visit: Payer: Self-pay | Admitting: Podiatry

## 2022-11-17 ENCOUNTER — Encounter: Payer: Self-pay | Admitting: Cardiovascular Disease

## 2022-11-17 NOTE — Progress Notes (Signed)
Cardiology Office Note:    Date:  11/17/2022   ID:  Renee Joseph, DOB [birth year], MRN [id number]  PCP:  Arlan Organ, MD   [facility] Providers Cardiologist:  Stephaun Million   Referring MD: Arlan Organ, MD   Chief Complaint  Patient presents with   Palpitations   DVT    Jan. 9 2023    Renee Joseph is a 52 y.o. female with a hx of CAD, ( mild CAD by CT angiogram )  DVT,    She is seen for the first time today for follow up of episodes of CP, palpitations, dyspnea.  She had several studies while out in [place]  Echo shows normal LV systolic function - EF 70%.  Trivial MR, mild TR  Carotid duplex showed no significant carotid artery disease  Has been on pradaxa for several years for DVT   Having more DOE ,  More palpitations - usually at rest   No regularl exercise  Had left knee replacement - 8-9 months ago .    Is limited by her joint pain  Has hereditary DJJD.  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 Sodium level was 140.  Potassium is 4.5.  Chloride is 110, BUN is 8, creatinine is 0.72  Oct 21, 2021:  See Renee Joseph, fiance She is seen for follow-up of her palpitations. She has a history of obesity and gastric bypass surgery.  Weight today is 213 pounds.  Her palpitations are more frequent  With activity or at rest  We started her on metoprolol 12. 5 BID  She had a event monitor in September, 2021 which showed episodes of nonsustained supraventricular tachycardia.  The longest episode was 12 beats with a max heart rate of 170s.  I suspect that we did not start high enough dose of metoprolol.  We will increase the Toprol to 25 mg twice a day.  Will get a BMP and TSH today   Nov 18, 2022 Renee Joseph  is seen for follow up of her palpitations, obesity, gastric bypass surgery   Past Medical History:  Diagnosis Date   CAD (coronary artery disease)    Chronic venous hypertension (idiopathic) with inflammation of right lower extremity    Chronic venous insufficiency    DVT (deep venous thrombosis) (HCC)    Neuropathy    Syncope, vasovagal    Tricuspid valve regurgitation    Valvular heart disease     Past Surgical History:  Procedure Laterality Date   APPENDECTOMY     BACK SURGERY     CARPAL TUNNEL RELEASE     GALLBLADDER SURGERY     KNEE SURGERY Bilateral    LAMINECTOMY     SLEEVE GASTROPLASTY     THYROIDECTOMY     TOTAL HIP ARTHROPLASTY      Current Medications: No outpatient medications have been marked as taking for the 11/18/22 encounter (Office Visit) with Emree Locicero, Deloris Ping, MD.     Allergies:   Crab extract, Other, Sulfa antibiotics, Apixaban, Erythromycin base, Ketorolac, Latex, Nabumetone, Pedi-pre tape spray [wound dressing adhesive], Penicillins, Pregabalin, Rivaroxaban, and Toradol [ketorolac tromethamine]   Social History   Socioeconomic  History   Marital status: Single    Spouse name: Not on file   Number of children: Not on file   Years of education: Not on file   Highest education level: Not on file  Occupational History   Not on file  Tobacco Use   Smoking status: Never   Smokeless tobacco: Never  Substance and Sexual Activity   Alcohol use: Never   Drug use: Never   Sexual activity: Not on file  Other Topics Concern   Not on file  Social History Narrative   Not on file   Social Determinants of Health   Financial Resource Strain: Not on file  Food Insecurity: Not on file  Transportation Needs: Not on file  Physical Activity: Not on file  Stress: Not on file  Social Connections: Not on file     Family History: The patient's family history includes Diabetes in her mother; Heart disease in her brother; Hypertension in her father and mother;  Stroke in her father.  ROS:   Please see the history of present illness.     All other systems reviewed and are negative.  EKGs/Labs/Other Studies Reviewed:    The following studies were reviewed today:   EKG:    Recent Labs: 07/08/2022: BUN 9; Creatinine, Ser 0.81; Hemoglobin 14.2; Magnesium 2.3; Platelets 194; Potassium 4.4; Sodium 144; TSH 3.210  Recent Lipid Panel No results found for: "CHOL", "TRIG", "HDL", "CHOLHDL", "VLDL", "LDLCALC", "LDLDIRECT"   Risk Assessment/Calculations:           Physical Exam:    Physical Exam: There were no vitals taken for this visit.  No BP recorded.  {Refresh Note OR Click here to enter BP  :1}***    GEN:  Well nourished, well developed in no acute distress HEENT: Normal NECK: No JVD; No carotid bruits LYMPHATICS: No lymphadenopathy CARDIAC: RRR ***, no murmurs, rubs, gallops RESPIRATORY:  Clear to auscultation without rales, wheezing or rhonchi  ABDOMEN: Soft, non-tender, non-distended MUSCULOSKELETAL:  No edema; No deformity  SKIN: Warm and dry NEUROLOGIC:  Alert and oriented x 3    ASSESSMENT:    No diagnosis found.   PLAN:       Palpitations:    Medication Adjustments/Labs and Tests Ordered: Current medicines are reviewed at length with the patient today.  Concerns regarding medicines are outlined above.  No orders of the defined types were placed in this encounter.  No orders of the defined types were placed in this encounter.   There are no Patient Instructions on file for this visit.   Signed, Kristeen Miss, MD  11/17/2022 8:01 PM    [facility]

## 2022-11-18 ENCOUNTER — Ambulatory Visit: Payer: Managed Care, Other (non HMO) | Attending: Cardiovascular Disease | Admitting: Cardiovascular Disease

## 2022-11-25 ENCOUNTER — Other Ambulatory Visit: Payer: Managed Care, Other (non HMO)

## 2022-12-03 ENCOUNTER — Other Ambulatory Visit: Payer: Self-pay | Admitting: Podiatry

## 2022-12-07 ENCOUNTER — Other Ambulatory Visit: Payer: Self-pay | Admitting: Podiatry

## 2022-12-17 ENCOUNTER — Other Ambulatory Visit: Payer: Managed Care, Other (non HMO)

## 2022-12-28 ENCOUNTER — Other Ambulatory Visit: Payer: Self-pay | Admitting: Podiatry

## 2022-12-31 ENCOUNTER — Other Ambulatory Visit: Payer: Managed Care, Other (non HMO)

## 2023-01-06 ENCOUNTER — Ambulatory Visit (INDEPENDENT_AMBULATORY_CARE_PROVIDER_SITE_OTHER): Payer: Managed Care, Other (non HMO) | Admitting: Podiatrist

## 2023-01-06 DIAGNOSIS — M722 Plantar fascial fibromatosis: Secondary | ICD-10-CM

## 2023-01-06 NOTE — Progress Notes (Signed)
Patient presents today to pick up orthotics. They are noted to contour the arch and foot nicely and fit well in the shoes.  Break in period recommended and instructions for wear given.    

## 2023-01-11 ENCOUNTER — Other Ambulatory Visit: Payer: Self-pay | Admitting: Podiatry

## 2023-01-19 ENCOUNTER — Other Ambulatory Visit: Payer: Self-pay | Admitting: Podiatry

## 2023-01-26 ENCOUNTER — Ambulatory Visit: Payer: Managed Care, Other (non HMO) | Admitting: Cardiovascular Disease

## 2023-02-23 ENCOUNTER — Other Ambulatory Visit: Payer: Self-pay | Admitting: Podiatry

## 2023-03-16 IMAGING — CT CT ABD-PELV W/ CM
2 of 5 series · 16 of 46 positions shown, 18 images · IV contrast (Omni 300)
Comparison: CT abdomen and pelvis 05/19/2021

CLINICAL DATA: Abdominal pain

EXAM:
CT ABDOMEN AND PELVIS WITH CONTRAST
TECHNIQUE: Multidetector CT imaging of the abdomen and pelvis was performed
using the standard protocol following bolus administration of
intravenous contrast.
CONTRAST:  100mL OMNIPAQUE IOHEXOL 300 MG/ML  SOLN

[Series 3: a/p w/ 5mm (person_name) · axial · 0.97mm/px · z∈[+741,+1171]mm · 13 of 98 slices shown, 15 images]
[im 6/98  soft-tissue]
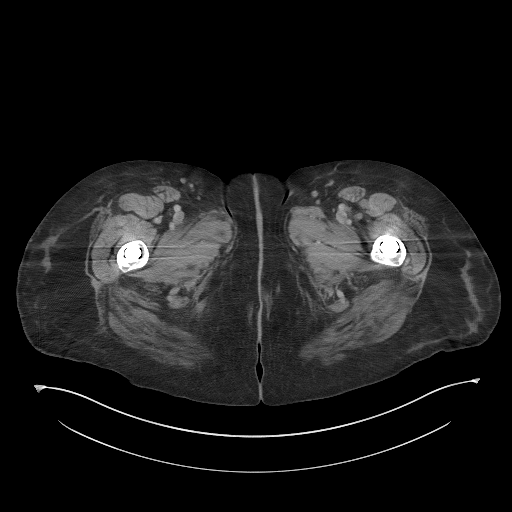
[im 6/98  bone]
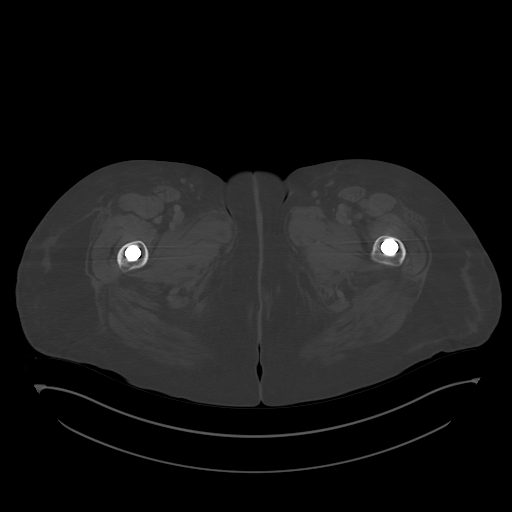
[im 11/98  soft-tissue]
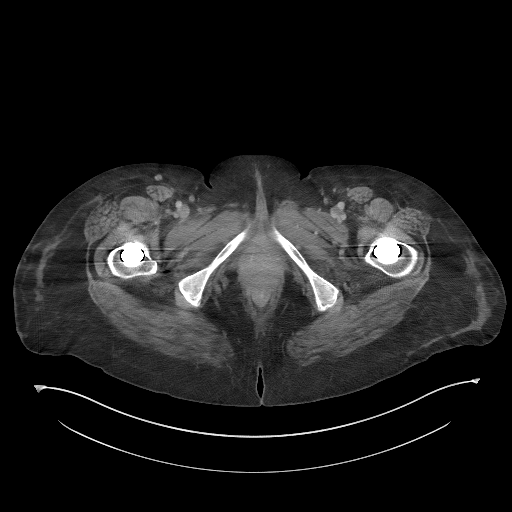
[im 22/98  soft-tissue]
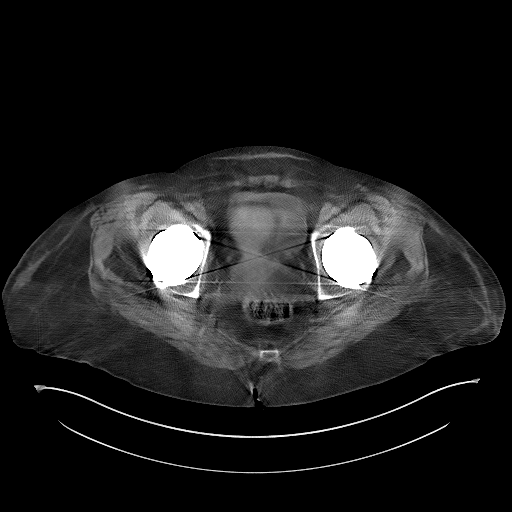
[im 27/98  soft-tissue]
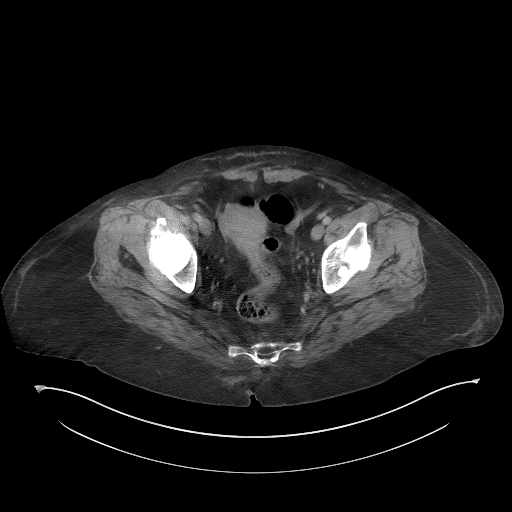
[im 33/98  soft-tissue]
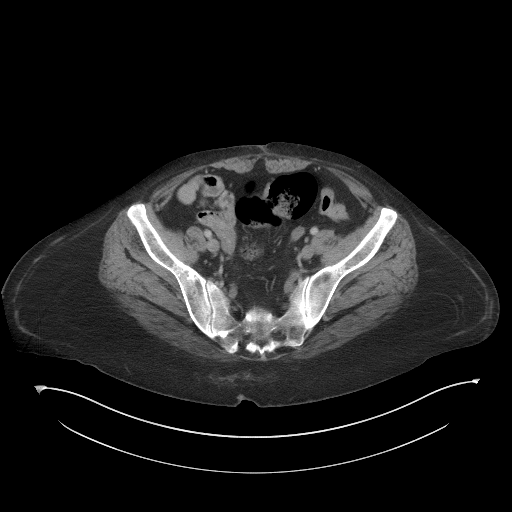
[im 44/98  soft-tissue]
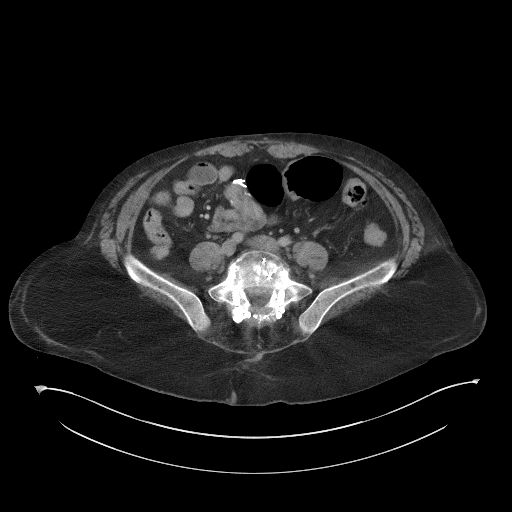
[im 49/98  soft-tissue]
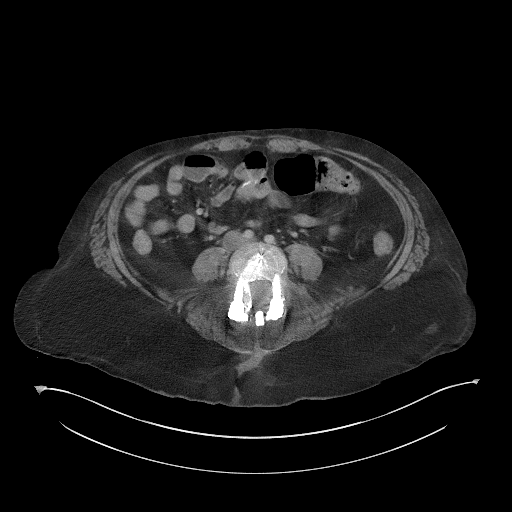
[im 54/98  soft-tissue]
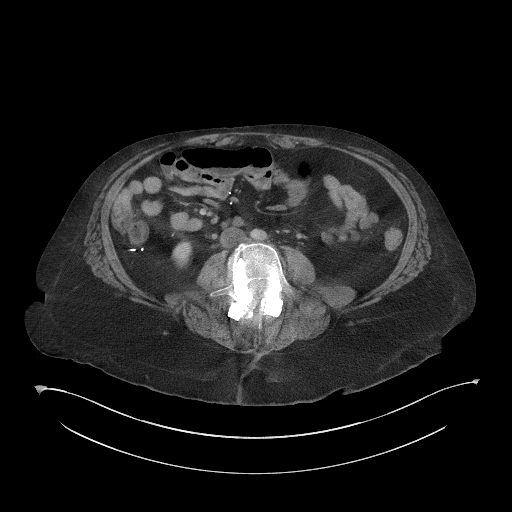
[im 65/98  soft-tissue]
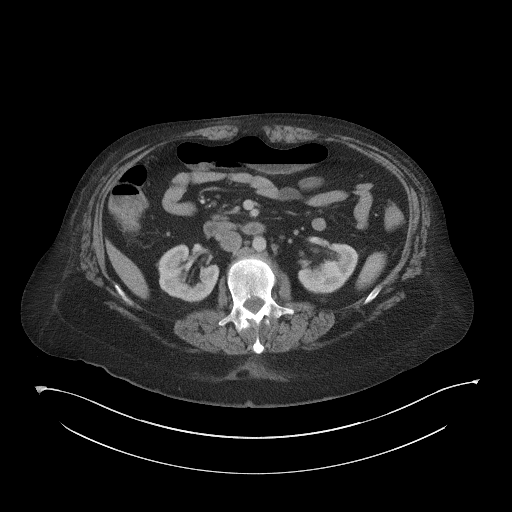
[im 65/98  bone]
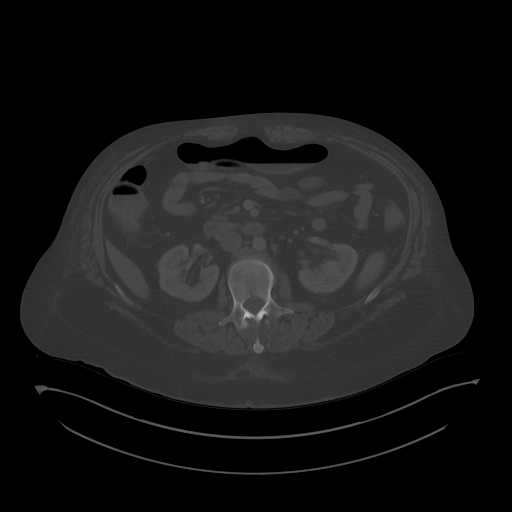
[im 71/98  soft-tissue]
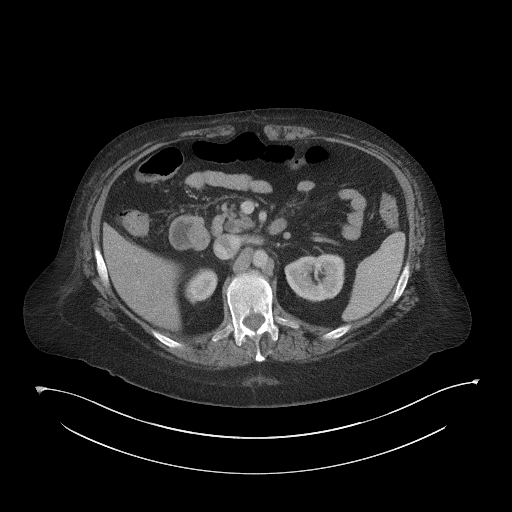
[im 76/98  soft-tissue]
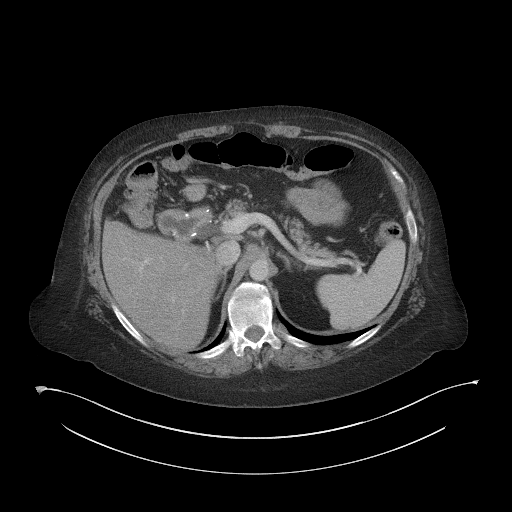
[im 87/98  soft-tissue]
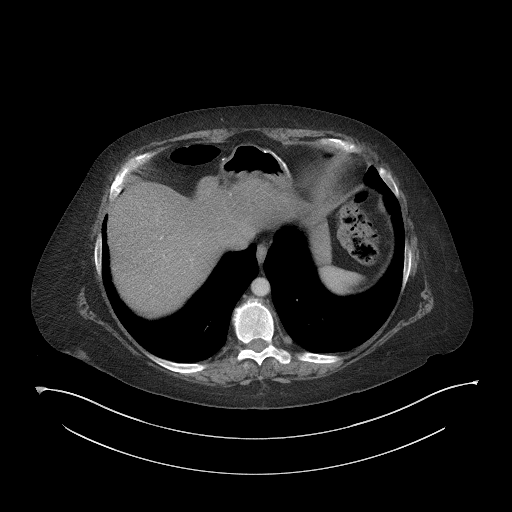
[im 92/98  soft-tissue]
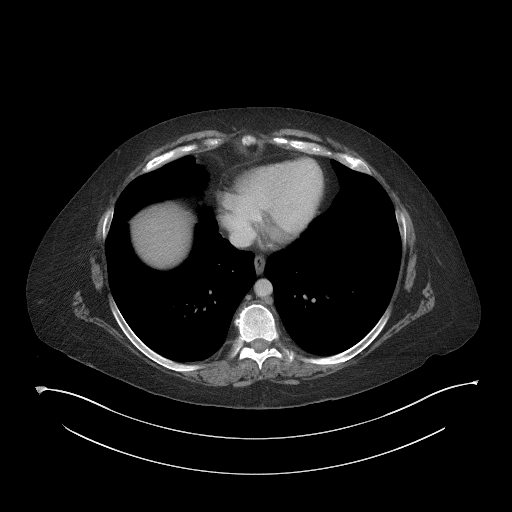

[Series 4: a/p w/ cor · coronal · 0.95mm/px · 3 of 151 slices shown]
[im 51/151  soft-tissue]
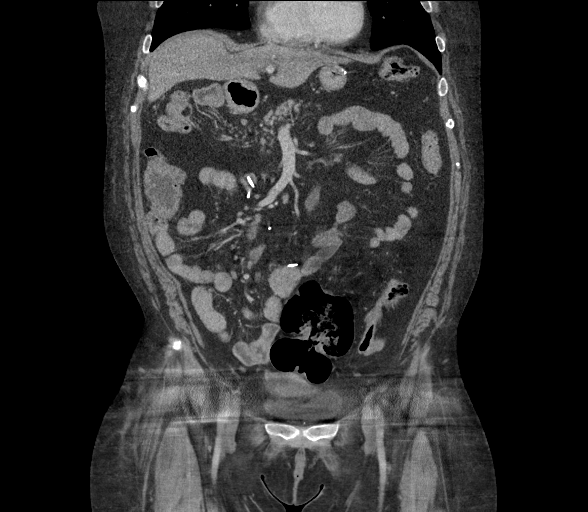
[im 67/151  soft-tissue]
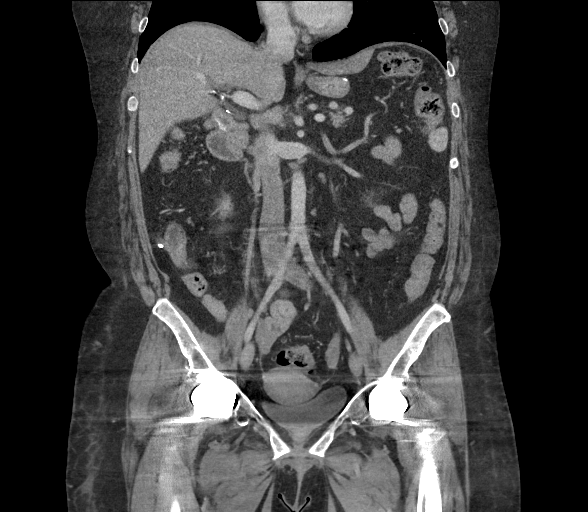
[im 84/151  soft-tissue]
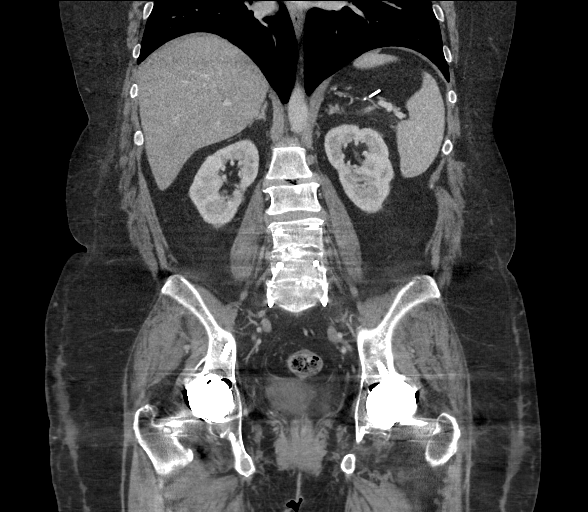

[16 of 46 positions shown; findings below may reference images not displayed]

FINDINGS: Lower chest: No acute abnormality.

Hepatobiliary: Liver is normal in size and contour with no
suspicious mass identified. Gallbladder is surgically absent. Mildly
dilated extrahepatic biliary ducts, common bile duct measures 9 mm
in diameter with tapering distally. Likely compensatory from
cholecystectomy.

Pancreas: Unremarkable. No pancreatic ductal dilatation or
surrounding inflammatory changes.

Spleen: Normal in size without focal abnormality.

Adrenals/Urinary Tract: Adrenal glands are unremarkable. Kidneys are
normal, without renal calculi, focal lesion, or hydronephrosis.
Bladder is grossly unremarkable, limited evaluation due to metallic
artifacts.

Stomach/Bowel: Postsurgical changes of the stomach and bowel,
unchanged. No bowel obstruction, free air or pneumatosis. No bowel
wall edema identified. Appendectomy changes.

Vascular/Lymphatic: No significant vascular findings are present. No
enlarged abdominal or pelvic lymph nodes.

Reproductive: Uterus and bilateral adnexa are unremarkable.

Other: No ascites.

Musculoskeletal: Degenerative and postsurgical changes in the lumbar
spine. Bilateral hip arthroplasties. No suspicious bony lesions
identified.
IMPRESSION: No acute process identified. Stable postsurgical and chronic
changes.

## 2023-03-18 ENCOUNTER — Other Ambulatory Visit: Payer: Self-pay | Admitting: Podiatry

## 2023-03-22 ENCOUNTER — Other Ambulatory Visit: Payer: Self-pay | Admitting: Podiatry

## 2023-04-08 ENCOUNTER — Encounter: Payer: Self-pay | Admitting: Cardiovascular Disease

## 2023-04-08 NOTE — Progress Notes (Signed)
Cardiology Office Note:    Date:  04/12/2023   ID:  Renee Joseph, DOB 1970/07/01, MRN [id number]  PCP:  Arlan Organ, MD   [facility] Providers Cardiologist:  Vinton Layson   Referring MD: Arlan Organ, MD   Chief Complaint  Patient presents with   DVT   Palpitations    Jan. 9 2023    Renee Joseph is a 52 y.o. female with a hx of CAD, ( mild CAD by CT angiogram )  DVT,    She is seen for the first time today for follow up of episodes of CP, palpitations, dyspnea.  She had several studies while out in [place]  Echo shows normal LV systolic function - EF 70%.  Trivial MR, mild TR  Carotid duplex showed no significant carotid artery disease  Has been on pradaxa for several years for DVT   Having more DOE ,  More palpitations - usually at rest   No regularl exercise  Had left knee replacement - 8-9 months ago .    Is limited by her joint pain  Has hereditary DJJD.  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 Sodium level was 140.  Potassium is 4.5.  Chloride is 110, BUN is 8, creatinine is 0.72  Oct 21, 2021:  See William Hamburger, fiance She is seen for follow-up of her palpitations. She has a history of obesity and gastric bypass surgery.  Weight today is 213 pounds.  Her palpitations are more frequent  With activity or at rest  We started her on metoprolol 12. 5 BID  She had a event monitor in September, 2021 which showed episodes of nonsustained supraventricular tachycardia.  The longest episode was 12 beats with a max heart rate of 170s.  I suspect that we did not start high enough dose of metoprolol.  We will increase the Toprol to 25 mg twice a day.  Will get a BMP and TSH today   Oct. 21,  2024 Renee Joseph is seen for follow up of her palpitations,  We increased her Toprol XL  Palpitations seem better after we increased her Toprol XL Long-term monitor revealed very brief episodes of SVT.  Her longest episode was 11 beats. At this time I do not think that referral to electrophysiology to consider ablation would be advisable.  Her episodes of SVT are very brief.  She does have episodes of PACs and premature ventricular contractions.  I encouraged her to get some regular cardio exercise.   Past Medical History:  Diagnosis Date   CAD (coronary artery disease)    Chronic venous hypertension (idiopathic) with inflammation of right lower extremity    Chronic venous insufficiency    DVT (deep venous thrombosis) (HCC)    Neuropathy    Syncope, vasovagal    Tricuspid valve regurgitation    Valvular heart disease     Past Surgical History:  Procedure Laterality Date   APPENDECTOMY     BACK SURGERY     CARPAL TUNNEL RELEASE     GALLBLADDER SURGERY     KNEE SURGERY Bilateral    LAMINECTOMY     SLEEVE GASTROPLASTY  THYROIDECTOMY     TOTAL HIP ARTHROPLASTY      Current Medications: Current Meds  Medication Sig   ALPRAZolam (XANAX) 0.5 MG tablet Take 0.5 mg by mouth every morning.   cyclobenzaprine (FLEXERIL) 10 MG tablet Take 10 mg by mouth 3 (three) times daily.   dabigatran (PRADAXA) 150 MG CAPS capsule Take 150 mg by mouth 2 (two) times daily.   EPINEPHrine 0.3 mg/0.3 mL IJ SOAJ injection 1 pre-filled pen syringe IM PRN   FEROSUL 325 (65 Fe) MG tablet Take 325 mg by mouth daily.   gabapentin (NEURONTIN) 300 MG capsule Take 600 mg by mouth 3 (three) times daily.   meloxicam (MOBIC) 15 MG tablet Take 1 tablet (15 mg total) by mouth daily.   methocarbamol (ROBAXIN) 750 MG tablet Take 750 mg by mouth 2 (two) times daily.   metoprolol tartrate (LOPRESSOR) 25 MG tablet Take 1.5 tablets (37.5 mg total) by mouth 2 (two) times daily.   omeprazole (PRILOSEC) 20 MG capsule Take  20 mg by mouth daily.   ondansetron (ZOFRAN-ODT) 4 MG disintegrating tablet 4mg  ODT q4 hours prn nausea/vomit   pantoprazole (PROTONIX) 40 MG tablet Take 40 mg by mouth daily.   sucralfate (CARAFATE) 1 g tablet Take 1 g by mouth 4 (four) times daily -  with meals and at bedtime.   traMADol (ULTRAM) 50 MG tablet Take 50 mg by mouth every 8 (eight) hours as needed for severe pain.   venlafaxine XR (EFFEXOR-XR) 75 MG 24 hr capsule Take 225 mg by mouth daily.   zolpidem (AMBIEN) 10 MG tablet Take 10 mg by mouth at bedtime as needed.     Allergies:   Crab extract, Other, Sulfa antibiotics, Apixaban, Erythromycin base, Ketorolac, Latex, Nabumetone, Pedi-pre tape spray [wound dressing adhesive], Penicillins, Pregabalin, Rivaroxaban, and Toradol [ketorolac tromethamine]   Social History   Socioeconomic History   Marital status: Single    Spouse name: Not on file   Number of children: Not on file   Years of education: Not on file   Highest education level: Not on file  Occupational History   Not on file  Tobacco Use   Smoking status: Never   Smokeless tobacco: Never  Substance and Sexual Activity   Alcohol use: Never   Drug use: Never   Sexual activity: Not on file  Other Topics Concern   Not on file  Social History Narrative   Not on file   Social Determinants of Health   Financial Resource Strain: Not on file  Food Insecurity: Medium Risk (03/08/2023)   Received from [facility]   Hunger Vital Sign    Worried About Running Out of Food in the Last Year: Sometimes true    Ran Out of Food in the Last Year: Sometimes true  Transportation Needs: No Transportation Needs (03/08/2023)   Received from [facility]    In the past 12 months, has lack of reliable transportation kept you from medical appointments, meetings, work or from getting things needed for daily living? : No  Physical Activity: Not on file  Stress: Not on file  Social Connections: Not on file      Family History: The patient's family history includes Diabetes in her mother; Heart disease in her brother; Hypertension in her father and mother; Stroke in her father.  ROS:   Please see the history of present illness.     All other systems reviewed and are negative.  EKGs/Labs/Other Studies Reviewed:  The following studies were reviewed today:   EKG:       Recent Labs: 07/08/2022: BUN 9; Creatinine, Ser 0.81; Hemoglobin 14.2; Magnesium 2.3; Platelets 194; Potassium 4.4; Sodium 144; TSH 3.210  Recent Lipid Panel No results found for: "CHOL", "TRIG", "HDL", "CHOLHDL", "VLDL", "LDLCALC", "LDLDIRECT"   Risk Assessment/Calculations:           Physical Exam:     Physical Exam: Blood pressure 102/60, pulse 61, height 5\' 4"  (1.626 m), weight 172 lb 12.8 oz (78.4 kg), SpO2 98%.       GEN:  Well nourished, well developed in no acute distress HEENT: Normal NECK: No JVD; No carotid bruits LYMPHATICS: No lymphadenopathy CARDIAC: RRR , no murmurs, rubs, gallops RESPIRATORY:  Clear to auscultation without rales, wheezing or rhonchi  ABDOMEN: Soft, non-tender, non-distended MUSCULOSKELETAL:  No edema; No deformity  SKIN: Warm and dry NEUROLOGIC:  Alert and oriented x 3   ASSESSMENT:    1. Follow-up exam      PLAN:       Palpitations:   Palpitations seem better after we increased her Toprol XL Long-term monitor revealed very brief episodes of SVT.  Her longest episode was 11 beats.  At this time I do not think that referral to electrophysiology to consider ablation would be advisable.  Her episodes of SVT are very brief.  She does have episodes of PACs and premature ventricular contractions.  I encouraged her to get some regular cardio exercise.   Medication Adjustments/Labs and Tests Ordered: Current medicines are reviewed at length with the patient today.  Concerns regarding medicines are outlined above.  Orders Placed This Encounter  Procedures   EKG  12-Lead   No orders of the defined types were placed in this encounter.   Patient Instructions  Medication Instructions:  Your physician recommends that you continue on your current medications as directed. Please refer to the Current Medication list given to you today.  *If you need a refill on your cardiac medications before your next appointment, please call your pharmacy*  Lab Work: If you have labs (blood work) drawn today and your tests are completely normal, you will receive your results only by: MyChart Message (if you have MyChart) OR A paper copy in the mail If you have any lab test that is abnormal or we need to change your treatment, we will call you to review the results.  Testing/Procedures: None ordered today.  Follow-Up: At [facility], you and your health needs are our priority.  As part of our continuing mission to provide you with exceptional heart care, we have created designated Provider Care Teams.  These Care Teams include your primary Cardiologist (physician) and Advanced Practice Providers (APPs -  Physician Assistants and Nurse Practitioners) who all work together to provide you with the care you need, when you need it.  We recommend signing up for the patient portal called "MyChart".  Sign up information is provided on this After Visit Summary.  MyChart is used to connect with patients for Virtual Visits (Telemedicine).  Patients are able to view lab/test results, encounter notes, upcoming appointments, etc.  Non-urgent messages can be sent to your provider as well.   To learn more about what you can do with MyChart, go to [email or web address].    Your next appointment:   1 year(s)  Provider:   Dr. Servando Salina        Signed, Kristeen Miss, MD  04/12/2023 11:52 AM    [facility]  [facility]

## 2023-04-12 ENCOUNTER — Ambulatory Visit: Payer: Managed Care, Other (non HMO) | Attending: Cardiovascular Disease | Admitting: Cardiovascular Disease

## 2023-04-12 ENCOUNTER — Encounter: Payer: Self-pay | Admitting: Cardiovascular Disease

## 2023-04-12 VITALS — BP 102/60 | HR 61 | Ht 64.0 in | Wt 172.8 lb

## 2023-04-12 DIAGNOSIS — Z09 Encounter for follow-up examination after completed treatment for conditions other than malignant neoplasm: Secondary | ICD-10-CM | POA: Diagnosis not present

## 2023-04-12 NOTE — Patient Instructions (Signed)
Medication Instructions:  Your physician recommends that you continue on your current medications as directed. Please refer to the Current Medication list given to you today.  *If you need a refill on your cardiac medications before your next appointment, please call your pharmacy*  Lab Work: If you have labs (blood work) drawn today and your tests are completely normal, you will receive your results only by: MyChart Message (if you have MyChart) OR A paper copy in the mail If you have any lab test that is abnormal or we need to change your treatment, we will call you to review the results.  Testing/Procedures: None ordered today.  Follow-Up: At [facility], you and your health needs are our priority.  As part of our continuing mission to provide you with exceptional heart care, we have created designated Provider Care Teams.  These Care Teams include your primary Cardiologist (physician) and Advanced Practice Providers (APPs -  Physician Assistants and Nurse Practitioners) who all work together to provide you with the care you need, when you need it.  We recommend signing up for the patient portal called "MyChart".  Sign up information is provided on this After Visit Summary.  MyChart is used to connect with patients for Virtual Visits (Telemedicine).  Patients are able to view lab/test results, encounter notes, upcoming appointments, etc.  Non-urgent messages can be sent to your provider as well.   To learn more about what you can do with MyChart, go to [email or web address].    Your next appointment:   1 year(s)  Provider:   Dr. Servando Salina

## 2023-04-13 ENCOUNTER — Other Ambulatory Visit: Payer: Self-pay | Admitting: Podiatry

## 2023-05-10 ENCOUNTER — Other Ambulatory Visit: Payer: Self-pay | Admitting: Podiatry

## 2023-05-24 ENCOUNTER — Other Ambulatory Visit: Payer: Self-pay | Admitting: Podiatry

## 2023-06-21 ENCOUNTER — Other Ambulatory Visit: Payer: Self-pay | Admitting: Podiatry

## 2023-07-20 ENCOUNTER — Other Ambulatory Visit: Payer: Self-pay | Admitting: Physician Assistant

## 2023-07-20 DIAGNOSIS — R002 Palpitations: Secondary | ICD-10-CM

## 2023-08-10 ENCOUNTER — Encounter: Payer: Managed Care, Other (non HMO) | Admitting: Radiology

## 2023-08-16 ENCOUNTER — Other Ambulatory Visit: Payer: Self-pay | Admitting: Orthopedic Surgery

## 2023-08-16 DIAGNOSIS — M25562 Pain in left knee: Secondary | ICD-10-CM

## 2023-08-18 ENCOUNTER — Ambulatory Visit
Admission: RE | Admit: 2023-08-18 | Discharge: 2023-08-18 | Disposition: A | Discharge status: Home / Self Care | Payer: Managed Care, Other (non HMO) | Source: Ambulatory Visit | Attending: Orthopedic Surgery | Admitting: Orthopedic Surgery

## 2023-08-18 DIAGNOSIS — M25562 Pain in left knee: Secondary | ICD-10-CM

## 2023-08-31 ENCOUNTER — Encounter: Payer: Managed Care, Other (non HMO) | Admitting: Radiology

## 2023-10-11 ENCOUNTER — Encounter: Payer: Self-pay | Admitting: Cardiovascular Disease

## 2023-10-12 ENCOUNTER — Other Ambulatory Visit: Payer: Self-pay

## 2023-10-14 ENCOUNTER — Encounter: Admitting: Radiology

## 2023-10-17 ENCOUNTER — Encounter: Payer: Self-pay | Admitting: Cardiovascular Disease

## 2023-10-19 ENCOUNTER — Encounter: Payer: Self-pay | Admitting: Cardiovascular Disease

## 2023-10-19 ENCOUNTER — Ambulatory Visit: Attending: Cardiovascular Disease | Admitting: Cardiovascular Disease

## 2023-10-19 VITALS — BP 116/70 | HR 73 | Ht 64.0 in | Wt 180.0 lb

## 2023-10-19 DIAGNOSIS — R002 Palpitations: Secondary | ICD-10-CM

## 2023-10-19 DIAGNOSIS — I2510 Atherosclerotic heart disease of native coronary artery without angina pectoris: Secondary | ICD-10-CM

## 2023-10-19 NOTE — Progress Notes (Signed)
 Cardiology Office Note:    Date:  10/19/2023   ID:  Renee Joseph, DOB May 17, 1971, MRN [id number]  PCP:  Sophie Dutch, MD   [facility] Providers Cardiologist:  Brian Kocourek   Referring MD: Sophie Dutch, MD   Chief Complaint  Patient presents with   Palpitations    Jan. 9 2023    Renee Joseph is a 53 y.o. female with a hx of CAD, ( mild CAD by CT angiogram )  DVT,    She is seen for the first time today for follow up of episodes of CP, palpitations, dyspnea.  She had several studies while out in [place]   Echo shows normal LV systolic function - EF 70%.  Trivial MR, mild TR  Carotid duplex showed no significant carotid artery disease  Has been on pradaxa for several years for DVT   Having more DOE ,  More palpitations - usually at rest   No regularl exercise  Had left knee replacement - 8-9 months ago .    Is limited by her joint pain  Has hereditary DJJD.  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 Sodium level was 140.  Potassium is 4.5.  Chloride is 110, BUN is 8, creatinine is 0.72  Oct 21, 2021:  See Renee Joseph, fiance She is seen for follow-up of her palpitations. She has a history of obesity and gastric bypass surgery.  Weight today is 213 pounds.  Her palpitations are more frequent  With activity or at rest  We started her on metoprolol  12. 5 BID  She had a event monitor in September, 2021 which showed episodes of nonsustained supraventricular tachycardia.  The longest episode was 12 beats with a max heart rate of 170s.  I suspect that we did not start high enough dose of metoprolol .  We will increase the Toprol  to 25 mg twice a day.  Will get a BMP and TSH today   Oct. 21, 2024 Renee Joseph is  seen for follow up of her palpitations,  We increased her Toprol  XL  Palpitations seem better after we increased her Toprol  XL Long-term monitor revealed very brief episodes of SVT.  Her longest episode was 11 beats. At this time I do not think that referral to electrophysiology to consider ablation would be advisable.  Her episodes of SVT are very brief.  She does have episodes of PACs and premature ventricular contractions.  I encouraged her to get some regular cardio exercise.  October 19, 2023:   Renee Joseph is seen today for follow-up of her palpitations and supraventricular tachycardia.  She is on metoprolol  XL.  Is having more dizziness,  Occurs more often at work  Not necessarily associated with tachycardia Does not eat anything before going to work  Then works a 12 hour shift  I have suggested to her that it is likely her lack of adequate hydration and adequate food that is causing these episodes of dizziness.  She is working a 12-hour shift after a full night sleep.   I have told her to correct this and eat and drink better.  If she still has dizziness after eating and drinking adequately  then we can certainly see her for consideration of determining whether or not her SVT needs to be ablated.   Past Medical History:  Diagnosis Date   CAD (coronary artery disease)    Chronic venous hypertension (idiopathic) with inflammation of right lower extremity    Chronic venous insufficiency    DVT (deep venous thrombosis) (HCC)    Neuropathy    Syncope, vasovagal    Tricuspid valve regurgitation    Valvular heart disease     Past Surgical History:  Procedure Laterality Date   APPENDECTOMY     BACK SURGERY     CARPAL TUNNEL RELEASE     GALLBLADDER SURGERY     KNEE SURGERY Bilateral    LAMINECTOMY     SLEEVE GASTROPLASTY     THYROIDECTOMY     TOTAL HIP ARTHROPLASTY      Current Medications: Current Meds  Medication Sig   ALPRAZolam (XANAX) 0.5 MG tablet Take 0.5 mg by mouth  every morning.   celecoxib (CELEBREX) 200 MG capsule Take 200 mg by mouth 2 (two) times daily.   CREON 36000-114000 units CPEP capsule Take 36,000 Units by mouth.   cyclobenzaprine (FLEXERIL) 10 MG tablet Take 10 mg by mouth 3 (three) times daily.   dabigatran (PRADAXA) 150 MG CAPS capsule Take 150 mg by mouth 2 (two) times daily.   EPINEPHrine 0.3 mg/0.3 mL IJ SOAJ injection 1 pre-filled pen syringe IM PRN   famotidine  (PEPCID ) 40 MG tablet Take 40 mg by mouth daily.   FEROSUL 325 (65 Fe) MG tablet Take 325 mg by mouth daily.   gabapentin (NEURONTIN) 300 MG capsule Take 600 mg by mouth 3 (three) times daily.   meloxicam  (MOBIC ) 15 MG tablet Take 1 tablet (15 mg total) by mouth daily.   methocarbamol (ROBAXIN) 750 MG tablet Take 750 mg by mouth 2 (two) times daily.   metoprolol  tartrate (LOPRESSOR ) 25 MG tablet TAKE 1 & 1/2 (ONE & ONE-HALF) TABLETS BY MOUTH TWICE DAILY   naloxone (NARCAN) nasal spray 4 mg/0.1 mL Place 1 spray into the nose.   omeprazole (PRILOSEC) 20 MG capsule Take 20 mg by mouth daily.   ondansetron  (ZOFRAN -ODT) 4 MG disintegrating tablet 4mg  ODT q4 hours prn nausea/vomit   pantoprazole (PROTONIX) 40 MG tablet Take 40 mg by mouth daily.   sucralfate (CARAFATE) 1 g tablet Take 1 g by mouth 4 (four) times daily -  with meals and at bedtime.   traMADol (ULTRAM) 50 MG tablet Take 50 mg by mouth every 8 (eight) hours as needed for severe pain.   venlafaxine XR (EFFEXOR-XR) 75 MG 24 hr capsule Take 225 mg by mouth daily.   Vitamin D, Ergocalciferol, (DRISDOL) 1.25 MG (50000 UNIT) CAPS capsule Take 50,000 Units by mouth once a week.   ZEPBOUND 2.5 MG/0.5ML Pen Inject 2.5 mg into the skin.   zolpidem (AMBIEN) 10 MG tablet Take 10 mg by mouth at bedtime as needed.     Allergies:   Crab extract, Other, Sulfa antibiotics, Apixaban, Erythromycin base, Ketorolac, Latex, Nabumetone, Pedi-pre tape spray [wound dressing adhesive], Penicillins, Pregabalin, Rivaroxaban, and Toradol  [ketorolac tromethamine]   Social History   Socioeconomic History   Marital status: Single    Spouse name: Not on file   Number of children: Not on file   Years of education: Not on file   Highest education level: Not on file  Occupational History   Not on file  Tobacco Use   Smoking status: Never   Smokeless  tobacco: Never  Substance and Sexual Activity   Alcohol use: Never   Drug use: Never   Sexual activity: Not on file  Other Topics Concern   Not on file  Social History Narrative   Not on file   Social Drivers of Health   Financial Resource Strain: Not on file  Food Insecurity: Medium Risk (03/08/2023)   Received from [facility]   Hunger Vital Sign    Worried About Running Out of Food in the Last Year: Sometimes true    Ran Out of Food in the Last Year: Sometimes true  Transportation Needs: No Transportation Needs (03/08/2023)   Received from [facility]    In the past 12 months, has lack of reliable transportation kept you from medical appointments, meetings, work or from getting things needed for daily living? : No  Physical Activity: Not on file  Stress: Not on file  Social Connections: Not on file     Family History: The patient's family history includes Diabetes in her mother; Heart disease in her brother; Hypertension in her father and mother; Stroke in her father.  ROS:   Please see the history of present illness.     All other systems reviewed and are negative.  EKGs/Labs/Other Studies Reviewed:    The following studies were reviewed today:   EKG:           Recent Labs: No results found for requested labs within last 365 days.  Recent Lipid Panel No results found for: "CHOL", "TRIG", "HDL", "CHOLHDL", "VLDL", "LDLCALC", "LDLDIRECT"   Risk Assessment/Calculations:           Physical Exam:      Physical Exam: Blood pressure 116/70, pulse 73, height 5\' 4"  (1.626 m), weight 180 lb (81.6 kg), SpO2 97%.       GEN:   Well nourished, well developed in no acute distress HEENT: Normal NECK: No JVD; No carotid bruits LYMPHATICS: No lymphadenopathy CARDIAC: RRR , no murmurs, rubs, gallops RESPIRATORY:  Clear to auscultation without rales, wheezing or rhonchi  ABDOMEN: Soft, non-tender, non-distended MUSCULOSKELETAL:  No edema; No deformity  SKIN: Warm and dry NEUROLOGIC:  Alert and oriented x 3   ASSESSMENT:    1. Coronary artery disease involving native coronary artery of native heart without angina pectoris   2. Palpitations       PLAN:       Palpitations: Renee Joseph has episodes of supraventricular tachycardia.  The event monitor from March, 2024 revealed brief episodes of SVT lasting anywhere from 4 beats to 11 beats.  I doubt that these are associated with symptoms.  She is having some dizziness that I suggest is due to her inadequate hydration and inadequate food intake.  Will have her follow-up with us  in 6 months.     Medication Adjustments/Labs and Tests Ordered: Current medicines are reviewed at length with the patient today.  Concerns regarding medicines are outlined above.  No orders of the defined types were placed in this encounter.  No orders of the defined types were placed in this encounter.   Patient Instructions   Follow-Up: At [facility], you and your health needs are our priority.  As part of our continuing mission to provide you with exceptional heart care, our providers are all part of one team.  This team includes your primary Cardiologist (physician) and Advanced Practice Providers or APPs (Physician Assistants and Nurse Practitioners) who all work together to provide you with the care you  need, when you need it.  Your next appointment:   6 month(s)  Provider:   Ahmad Alert, MD      Signed, Ahmad Alert, MD  10/19/2023 6:26 PM    [facility]

## 2023-10-19 NOTE — Patient Instructions (Signed)
  Follow-Up: At [facility], you and your health needs are our priority.  As part of our continuing mission to provide you with exceptional heart care, our providers are all part of one team.  This team includes your primary Cardiologist (physician) and Advanced Practice Providers or APPs (Physician Assistants and Nurse Practitioners) who all work together to provide you with the care you need, when you need it.  Your next appointment:   6 month(s)  Provider:   Ahmad Alert, MD

## 2023-12-02 ENCOUNTER — Encounter: Admitting: Radiology

## 2024-01-26 ENCOUNTER — Encounter: Admitting: Radiology

## 2024-02-23 ENCOUNTER — Encounter: Admitting: Radiology

## 2024-04-17 ENCOUNTER — Other Ambulatory Visit: Payer: Self-pay

## 2024-04-17 DIAGNOSIS — R002 Palpitations: Secondary | ICD-10-CM

## 2024-04-19 MED ORDER — METOPROLOL TARTRATE 25 MG PO TABS: 37.5000 mg | ORAL_TABLET | Freq: Two times a day (BID) | ORAL | 1 refills | Status: AC

## 2024-04-26 ENCOUNTER — Emergency Department (HOSPITAL_BASED_OUTPATIENT_CLINIC_OR_DEPARTMENT_OTHER)

## 2024-04-26 ENCOUNTER — Other Ambulatory Visit: Payer: Self-pay

## 2024-04-26 ENCOUNTER — Observation Stay (HOSPITAL_BASED_OUTPATIENT_CLINIC_OR_DEPARTMENT_OTHER)
Admission: EM | Admit: 2024-04-26 | Discharge: 2024-04-27 | Disposition: A | Discharge status: Home / Self Care | Attending: Internal Medicine | Admitting: Internal Medicine

## 2024-04-26 DIAGNOSIS — I10 Essential (primary) hypertension: Secondary | ICD-10-CM | POA: Diagnosis not present

## 2024-04-26 DIAGNOSIS — E559 Vitamin D deficiency, unspecified: Secondary | ICD-10-CM | POA: Insufficient documentation

## 2024-04-26 DIAGNOSIS — J45909 Unspecified asthma, uncomplicated: Secondary | ICD-10-CM | POA: Insufficient documentation

## 2024-04-26 DIAGNOSIS — D509 Iron deficiency anemia, unspecified: Secondary | ICD-10-CM | POA: Insufficient documentation

## 2024-04-26 DIAGNOSIS — Z91040 Latex allergy status: Secondary | ICD-10-CM | POA: Diagnosis not present

## 2024-04-26 DIAGNOSIS — K56609 Unspecified intestinal obstruction, unspecified as to partial versus complete obstruction: Secondary | ICD-10-CM | POA: Diagnosis present

## 2024-04-26 DIAGNOSIS — G4700 Insomnia, unspecified: Secondary | ICD-10-CM | POA: Diagnosis not present

## 2024-04-26 DIAGNOSIS — I2510 Atherosclerotic heart disease of native coronary artery without angina pectoris: Secondary | ICD-10-CM | POA: Diagnosis not present

## 2024-04-26 DIAGNOSIS — R1084 Generalized abdominal pain: Secondary | ICD-10-CM | POA: Diagnosis not present

## 2024-04-26 DIAGNOSIS — K219 Gastro-esophageal reflux disease without esophagitis: Secondary | ICD-10-CM | POA: Diagnosis not present

## 2024-04-26 DIAGNOSIS — Z720 Tobacco use: Secondary | ICD-10-CM | POA: Diagnosis not present

## 2024-04-26 DIAGNOSIS — K222 Esophageal obstruction: Secondary | ICD-10-CM | POA: Diagnosis not present

## 2024-04-26 DIAGNOSIS — M542 Cervicalgia: Secondary | ICD-10-CM | POA: Diagnosis not present

## 2024-04-26 DIAGNOSIS — K8681 Exocrine pancreatic insufficiency: Secondary | ICD-10-CM | POA: Insufficient documentation

## 2024-04-26 DIAGNOSIS — R10A2 Flank pain, left side: Secondary | ICD-10-CM | POA: Diagnosis present

## 2024-04-26 DIAGNOSIS — Z7401 Bed confinement status: Secondary | ICD-10-CM | POA: Diagnosis not present

## 2024-04-26 LAB — CBC
HCT: 33.1 % — ABNORMAL LOW (ref 36.0–46.0)
Hemoglobin: 9.7 g/dL — ABNORMAL LOW (ref 12.0–15.0)
MCH: 25.5 pg — ABNORMAL LOW (ref 26.0–34.0)
MCHC: 29.3 g/dL — ABNORMAL LOW (ref 30.0–36.0)
MCV: 86.9 fL (ref 80.0–100.0)
Platelets: 173 K/uL (ref 150–400)
RBC: 3.81 MIL/uL — ABNORMAL LOW (ref 3.87–5.11)
RDW: 18.4 % — ABNORMAL HIGH (ref 11.5–15.5)
WBC: 3.6 K/uL — ABNORMAL LOW (ref 4.0–10.5)
nRBC: 0 % (ref 0.0–0.2)

## 2024-04-26 LAB — CBC WITH DIFFERENTIAL/PLATELET
Abs Immature Granulocytes: 0 K/uL (ref 0.00–0.07)
Basophils Absolute: 0 K/uL (ref 0.0–0.1)
Basophils Relative: 0 %
Eosinophils Absolute: 0 K/uL (ref 0.0–0.5)
Eosinophils Relative: 1 %
HCT: 29.1 % — ABNORMAL LOW (ref 36.0–46.0)
Hemoglobin: 8.9 g/dL — ABNORMAL LOW (ref 12.0–15.0)
Immature Granulocytes: 0 %
Lymphocytes Relative: 29 %
Lymphs Abs: 0.9 K/uL (ref 0.7–4.0)
MCH: 25.6 pg — ABNORMAL LOW (ref 26.0–34.0)
MCHC: 30.6 g/dL (ref 30.0–36.0)
MCV: 83.9 fL (ref 80.0–100.0)
Monocytes Absolute: 0.2 K/uL (ref 0.1–1.0)
Monocytes Relative: 7 %
Neutro Abs: 2 K/uL (ref 1.7–7.7)
Neutrophils Relative %: 63 %
Platelets: 158 K/uL (ref 150–400)
RBC: 3.47 MIL/uL — ABNORMAL LOW (ref 3.87–5.11)
RDW: 18.2 % — ABNORMAL HIGH (ref 11.5–15.5)
WBC: 3.1 K/uL — ABNORMAL LOW (ref 4.0–10.5)
nRBC: 0 % (ref 0.0–0.2)

## 2024-04-26 LAB — URINALYSIS, ROUTINE W REFLEX MICROSCOPIC
Bacteria, UA: NONE SEEN
Bilirubin Urine: NEGATIVE
Glucose, UA: NEGATIVE mg/dL
Ketones, ur: NEGATIVE mg/dL
Leukocytes,Ua: NEGATIVE
Nitrite: NEGATIVE
Protein, ur: NEGATIVE mg/dL
Specific Gravity, Urine: 1.009 (ref 1.005–1.030)
pH: 6.5 (ref 5.0–8.0)

## 2024-04-26 LAB — COMPREHENSIVE METABOLIC PANEL WITH GFR
ALT: 16 U/L (ref 0–44)
AST: 33 U/L (ref 15–41)
Albumin: 3.3 g/dL — ABNORMAL LOW (ref 3.5–5.0)
Alkaline Phosphatase: 160 U/L — ABNORMAL HIGH (ref 38–126)
Anion gap: 9 (ref 5–15)
BUN: 8 mg/dL (ref 6–20)
CO2: 19 mmol/L — ABNORMAL LOW (ref 22–32)
Calcium: 8.2 mg/dL — ABNORMAL LOW (ref 8.9–10.3)
Chloride: 113 mmol/L — ABNORMAL HIGH (ref 98–111)
Creatinine, Ser: 0.73 mg/dL (ref 0.44–1.00)
GFR, Estimated: >60 mL/min (ref >60)
Glucose, Bld: 76 mg/dL (ref 70–99)
Potassium: 4.1 mmol/L (ref 3.5–5.1)
Sodium: 140 mmol/L (ref 135–145)
Total Bilirubin: 0.5 mg/dL (ref 0.0–1.2)
Total Protein: 5.3 g/dL — ABNORMAL LOW (ref 6.5–8.1)

## 2024-04-26 LAB — OCCULT BLOOD X 1 CARD TO LAB, STOOL: Fecal Occult Bld: NEGATIVE

## 2024-04-26 LAB — LIPASE, BLOOD: Lipase: 19 U/L (ref 11–51)

## 2024-04-26 LAB — PREGNANCY, URINE: Preg Test, Ur: NEGATIVE

## 2024-04-26 LAB — HIV ANTIBODY (ROUTINE TESTING W REFLEX): HIV Screen 4th Generation wRfx: NONREACTIVE

## 2024-04-26 LAB — CREATININE, SERUM
Creatinine, Ser: 0.7 mg/dL (ref 0.44–1.00)
GFR, Estimated: >60 mL/min (ref >60)

## 2024-04-26 MED ORDER — BISACODYL 10 MG RE SUPP
10.0000 mg | Freq: Every day | RECTAL | Status: DC | PRN
Start: 2024-04-26 — End: 2024-04-27

## 2024-04-26 MED ORDER — CYCLOBENZAPRINE HCL 10 MG PO TABS
10.0000 mg | ORAL_TABLET | Freq: Every evening | ORAL | Status: DC | PRN
Start: 2024-04-26 — End: 2024-04-27

## 2024-04-26 MED ORDER — ENOXAPARIN SODIUM 40 MG/0.4ML IJ SOSY
40.0000 mg | PREFILLED_SYRINGE | INTRAMUSCULAR | Status: DC
  Administered 2024-04-26: 40 mg via SUBCUTANEOUS
  Filled 2024-04-26: qty 0.4

## 2024-04-26 MED ORDER — METHOCARBAMOL 500 MG PO TABS
750.0000 mg | ORAL_TABLET | Freq: Two times a day (BID) | ORAL | Status: DC
  Administered 2024-04-26: 750 mg via ORAL
  Filled 2024-04-26: qty 2

## 2024-04-26 MED ORDER — PANCRELIPASE (LIP-PROT-AMYL) 12000-38000 UNITS PO CPEP: 36000.0000 [IU] | ORAL_CAPSULE | ORAL | Status: DC

## 2024-04-26 MED ORDER — PANCRELIPASE (LIP-PROT-AMYL) 12000-38000 UNITS PO CPEP: 72000.0000 [IU] | ORAL_CAPSULE | Freq: Every day | ORAL | Status: DC

## 2024-04-26 MED ORDER — ONDANSETRON HCL 4 MG/2ML IJ SOLN
4.0000 mg | Freq: Once | INTRAMUSCULAR | Status: AC
  Administered 2024-04-26: 4 mg via INTRAVENOUS
  Filled 2024-04-26: qty 2

## 2024-04-26 MED ORDER — MORPHINE SULFATE (PF) 4 MG/ML IV SOLN
4.0000 mg | Freq: Once | INTRAVENOUS | Status: AC
  Administered 2024-04-26: 4 mg via INTRAVENOUS
  Filled 2024-04-26: qty 1

## 2024-04-26 MED ORDER — SODIUM CHLORIDE 0.9 % IV SOLN: INTRAVENOUS | Status: DC

## 2024-04-26 MED ORDER — IOHEXOL 300 MG/ML  SOLN
100.0000 mL | Freq: Once | INTRAMUSCULAR | Status: AC | PRN
  Administered 2024-04-26: 80 mL via INTRAVENOUS

## 2024-04-26 MED ORDER — LIDOCAINE 5 % EX PTCH
1.0000 | MEDICATED_PATCH | Freq: Once | CUTANEOUS | Status: AC
  Administered 2024-04-26: 1 via TRANSDERMAL
  Filled 2024-04-26: qty 1

## 2024-04-26 MED ORDER — ACETAMINOPHEN 325 MG PO TABS: 650.0000 mg | ORAL_TABLET | Freq: Four times a day (QID) | ORAL | Status: DC | PRN

## 2024-04-26 MED ORDER — ONDANSETRON HCL 4 MG PO TABS
4.0000 mg | ORAL_TABLET | Freq: Four times a day (QID) | ORAL | Status: DC | PRN
Start: 2024-04-26 — End: 2024-04-27

## 2024-04-26 MED ORDER — ALBUTEROL SULFATE (2.5 MG/3ML) 0.083% IN NEBU: 2.5000 mg | INHALATION_SOLUTION | RESPIRATORY_TRACT | Status: DC | PRN

## 2024-04-26 MED ORDER — GABAPENTIN 100 MG PO CAPS: 300.0000 mg | ORAL_CAPSULE | ORAL | Status: DC

## 2024-04-26 MED ORDER — ACETAMINOPHEN 650 MG RE SUPP: 650.0000 mg | Freq: Four times a day (QID) | RECTAL | Status: DC | PRN

## 2024-04-26 MED ORDER — HYDROXYZINE HCL 25 MG PO TABS
25.0000 mg | ORAL_TABLET | Freq: Four times a day (QID) | ORAL | Status: DC | PRN
Start: 2024-04-26 — End: 2024-04-27
  Administered 2024-04-26: 25 mg via ORAL
  Filled 2024-04-26: qty 1

## 2024-04-26 MED ORDER — PANCRELIPASE (LIP-PROT-AMYL) 12000-38000 UNITS PO CPEP: 36000.0000 [IU] | ORAL_CAPSULE | Freq: Every day | ORAL | Status: DC

## 2024-04-26 MED ORDER — GABAPENTIN 100 MG PO CAPS: 300.0000 mg | ORAL_CAPSULE | Freq: Every day | ORAL | Status: DC

## 2024-04-26 MED ORDER — ONDANSETRON HCL 4 MG/2ML IJ SOLN: 4.0000 mg | Freq: Four times a day (QID) | INTRAMUSCULAR | Status: DC | PRN

## 2024-04-26 MED ORDER — ZOLPIDEM TARTRATE 5 MG PO TABS
5.0000 mg | ORAL_TABLET | Freq: Every day | ORAL | Status: DC
  Administered 2024-04-26: 5 mg via ORAL
  Filled 2024-04-26: qty 1

## 2024-04-26 MED ORDER — METOPROLOL TARTRATE 25 MG PO TABS
37.5000 mg | ORAL_TABLET | Freq: Every day | ORAL | Status: DC
  Administered 2024-04-26: 37.5 mg via ORAL
  Filled 2024-04-26: qty 1

## 2024-04-26 MED ORDER — MORPHINE SULFATE (PF) 2 MG/ML IV SOLN
2.0000 mg | INTRAVENOUS | Status: DC | PRN
  Administered 2024-04-26 – 2024-04-27 (×2): 2 mg via INTRAVENOUS
  Filled 2024-04-26 (×2): qty 1

## 2024-04-26 MED ORDER — TRAMADOL HCL 50 MG PO TABS
50.0000 mg | ORAL_TABLET | Freq: Four times a day (QID) | ORAL | Status: DC | PRN
  Administered 2024-04-26 – 2024-04-27 (×2): 50 mg via ORAL
  Filled 2024-04-26 (×2): qty 1

## 2024-04-26 MED ORDER — GABAPENTIN 400 MG PO CAPS
600.0000 mg | ORAL_CAPSULE | Freq: Every day | ORAL | Status: DC
  Administered 2024-04-26: 600 mg via ORAL
  Filled 2024-04-26: qty 2

## 2024-04-26 MED ORDER — ALPRAZOLAM 0.5 MG PO TABS: 0.5000 mg | ORAL_TABLET | Freq: Every day | ORAL | Status: DC | PRN

## 2024-04-26 NOTE — ED Notes (Signed)
 Called [facility] to transport the patient to [facility] rm[facility] [id number]

## 2024-04-26 NOTE — Plan of Care (Signed)
 ?  Problem: Clinical Measurements: ?Goal: Will remain free from infection ?Outcome: Progressing ?  ?

## 2024-04-26 NOTE — Consult Note (Signed)
 Renee Joseph Joshua Ales [birth year]  [id number].    Requesting MD: Ellouise, MD Chief Complaint/Reason for Consult: SBO  HPI:  Renee Joseph is a 53 y/o F with MMP listed below who presented to [facility] with a cc abdominal pain. The patient, with a history of duodenal switch surgery, presents with persistent abdominal pain and nausea.  They have been experiencing abdominal pain that has been gradually worsening over the past two and a half weeks. The pain extends from the throat downwards and has become more persistent. There is uncertainty if the pain is related to food intake. They have been using tramadol, originally prescribed for back pain, and Tylenol to manage the pain.  Associated symptoms include nausea, dizziness, and occasional vomiting. Bowel movements have been irregular compared to their usual pattern. They have a history of duodenal switch surgery performed over 25 years ago and have had an upper and lower endoscopy in 2022 or 2023 due to similar symptoms of stomach issues, vomiting, and excessive gas. During the endoscopy, they required dilation of the esophagus.  They have a history of low vitamin D levels and have started injections for osteoporosis a few months ago. They take daily vitamins, including a focus on fatty vitamins, and have prescriptions for iron and vitamin D. They have had their appendix and gallbladder removed in separate surgeries, with the gallbladder removal occurring in 1995 and the appendix during the duodenal switch surgery.  They have a history of smoking, having quit during a previous hospital stay but have since resumed smoking, albeit less frequently. They deny any other drug use. They have undergone an abdominoplasty post-duodenal switch and have had surgery to remove scar tissue, which was initially suspected to be a hernia. 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  Substance use: former smoker, occasionally vapes, no other drug use ROS: Review of Systems  Gastrointestinal:  Positive for abdominal pain.  All other systems reviewed and are negative.   Family History  Problem Relation Age of Onset   Hypertension Mother    Diabetes Mother    Hypertension Father    Stroke Father    Heart disease Brother     Past Medical History:  Diagnosis Date   CAD (coronary artery disease)    Chronic venous hypertension (idiopathic) with inflammation of right lower extremity    Chronic venous insufficiency    DVT (deep venous thrombosis) (HCC)    Neuropathy    Syncope, vasovagal    Tricuspid valve regurgitation    Valvular heart disease     Past Surgical History:  Procedure Laterality Date   APPENDECTOMY     BACK SURGERY     CARPAL TUNNEL RELEASE     GALLBLADDER SURGERY     KNEE SURGERY Bilateral    LAMINECTOMY     SLEEVE GASTROPLASTY     THYROIDECTOMY     TOTAL HIP ARTHROPLASTY      Social History:  reports that she has never smoked. She has never used smokeless tobacco. She reports that she does not drink alcohol and does not use drugs.  Allergies:  Allergies  Allergen Reactions   Crab Extract Anaphylaxis    Only crab but can eat all other shellfish   Other Itching    All Cillinis  Sulfa Antibiotics Anaphylaxis   Apixaban Itching and Hives    Other reaction(s): Confusion (intolerance), Dizziness (intolerance)   Erythromycin Base Hives   Ketorolac Hives   Latex Hives    Powder inside the gloves    Nabumetone Hives   Pedi-Pre Tape Spray [Wound Dressing Adhesive] Hives    Plastic tape   Penicillins Hives   Pregabalin Itching   Rivaroxaban Itching and Hives    Other reaction(s): Confusion (intolerance), Dizziness (intolerance)   Toradol [Ketorolac Tromethamine] Hives  and Itching    (Not in a hospital admission)    Physical Exam: Blood pressure 100/67, pulse 70, temperature 98.4 F (36.9 C), temperature source Oral, resp. rate 16, SpO2 99%. General: Pleasant female laying on hospital bed, appears stated age, NAD. HEENT: head -normocephalic, atraumatic; Eyes: PERRLA, no conjunctival injection; anicteric sclerae  Neck- Trachea is midline CV- RRR, normal S1/S2, no M/R/G, no lower extremity edema  Pulm- breathing is non-labored ORA Abd- soft, no focal tenderness , previous laparotomy incision appears well healing, no masses, hernias, or organomegaly. GU- deferred  MSK- UE/LE symmetrical, no cyanosis, clubbing, or edema. Neuro- CN II-XII grossly in tact, no paresthesias. Psych- Alert and Oriented x3 with appropriate affect Skin: warm and dry, no rashes or lesions   Results for orders placed or performed during the hospital encounter of 04/26/24 (from the past 48 hours)  Comprehensive metabolic panel     Status: Abnormal   Collection Time: 04/26/24 10:23 AM  Result Value Ref Range   Sodium 140 135 - 145 mmol/L   Potassium 4.1 3.5 - 5.1 mmol/L    Comment: HEMOLYSIS AT THIS LEVEL MAY AFFECT RESULT   Chloride 113 (H) 98 - 111 mmol/L   CO2 19 (L) 22 - 32 mmol/L   Glucose, Bld 76 70 - 99 mg/dL    Comment: Glucose reference range applies only to samples taken after fasting for at least 8 hours.   BUN 8 6 - 20 mg/dL   Creatinine, Ser 9.26 0.44 - 1.00 mg/dL   Calcium 8.2 (L) 8.9 - 10.3 mg/dL   Total Protein 5.3 (L) 6.5 - 8.1 g/dL   Albumin 3.3 (L) 3.5 - 5.0 g/dL   AST 33 15 - 41 U/L    Comment: HEMOLYSIS AT THIS LEVEL MAY AFFECT RESULT   ALT 16 0 - 44 U/L   Alkaline Phosphatase 160 (H) 38 - 126 U/L   Total Bilirubin 0.5 0.0 - 1.2 mg/dL   GFR, Estimated >39 >39 mL/min    Comment: (NOTE) Calculated using the CKD-EPI Creatinine Equation (2021)    Anion gap 9 5 - 15    Comment: Performed at [facility], [address]  [place], [place] [address]  Lipase, blood     Status: None   Collection Time: 04/26/24 10:23 AM  Result Value Ref Range   Lipase 19 11 - 51 U/L    Comment: Performed at [facility], [address], [place], [place] [address]  CBC with Differential     Status: Abnormal   Collection Time: 04/26/24 10:23 AM  Result Value Ref Range   WBC 3.1 (L) 4.0 - 10.5 K/uL   RBC 3.47 (L) 3.87 - 5.11 MIL/uL   Hemoglobin 8.9 (L) 12.0 - 15.0 g/dL   HCT 70.8 (L) 63.9 - 53.9 %   MCV 83.9 80.0 - 100.0 fL   MCH 25.6 (L) 26.0 - 34.0 pg   MCHC 30.6 30.0 - 36.0 g/dL   RDW 81.7 (H) 88.4 -  15.5 %   Platelets 158 150 - 400 K/uL   nRBC 0.0 0.0 - 0.2 %   Neutrophils Relative % 63 %   Neutro Abs 2.0 1.7 - 7.7 K/uL   Lymphocytes Relative 29 %   Lymphs Abs 0.9 0.7 - 4.0 K/uL   Monocytes Relative 7 %   Monocytes Absolute 0.2 0.1 - 1.0 K/uL   Eosinophils Relative 1 %   Eosinophils Absolute 0.0 0.0 - 0.5 K/uL   Basophils Relative 0 %   Basophils Absolute 0.0 0.0 - 0.1 K/uL   Immature Granulocytes 0 %   Abs Immature Granulocytes 0.00 0.00 - 0.07 K/uL    Comment: Performed at [facility], [address], [place], [place] [address]  Pregnancy, urine     Status: None   Collection Time: 04/26/24 10:23 AM  Result Value Ref Range   Preg Test, Ur NEGATIVE NEGATIVE    Comment:        THE SENSITIVITY OF THIS METHODOLOGY IS >20 mIU/mL. Performed at [facility], [address], [place], [place] [address]   Urinalysis, Routine w reflex microscopic -Urine, Clean Catch     Status: Abnormal   Collection Time: 04/26/24 10:25 AM  Result Value Ref Range   Color, Urine YELLOW YELLOW   APPearance CLEAR CLEAR   Specific Gravity, Urine 1.009 1.005 - 1.030   pH 6.5 5.0 - 8.0   Glucose, UA NEGATIVE NEGATIVE mg/dL   Hgb urine dipstick SMALL (A) NEGATIVE   Bilirubin Urine NEGATIVE NEGATIVE   Ketones, ur NEGATIVE NEGATIVE mg/dL   Protein, ur NEGATIVE NEGATIVE mg/dL    Nitrite NEGATIVE NEGATIVE   Leukocytes,Ua NEGATIVE NEGATIVE   RBC / HPF 0-5 0 - 5 RBC/hpf   WBC, UA 0-5 0 - 5 WBC/hpf   Bacteria, UA NONE SEEN NONE SEEN   Squamous Epithelial / HPF 0-5 0 - 5 /HPF    Comment: Performed at [facility], [address], [place], [place] [address]  Occult blood card to lab, stool Provider will collect     Status: None   Collection Time: 04/26/24 12:45 PM  Result Value Ref Range   Fecal Occult Bld NEGATIVE NEGATIVE    Comment: Performed at [facility], [address], [place], [place] [address]   CT ABDOMEN PELVIS W CONTRAST Result Date: 04/26/2024 EXAM: CT ABDOMEN AND PELVIS WITH CONTRAST 04/26/2024 11:37:04 AM TECHNIQUE: CT of the abdomen and pelvis was performed with the administration of intravenous contrast. Multiplanar reformatted images are provided for review. Automated exposure control, iterative reconstruction, and/or weight-based adjustment of the mA/kV was utilized to reduce the radiation dose to as low as reasonably achievable. COMPARISON: CT abdomen and pelvis 04/05/2023. CLINICAL HISTORY: 53 year old female with LLQ abdominal pain. FINDINGS: LOWER CHEST: No acute abnormality. Normal heart size. No pericardial or pleural effusion. Lung bases are clear. Incidental breast implants. LIVER: Chronic hepatic steatosis. GALLBLADDER AND BILE DUCTS: Cholecystectomy. No biliary ductal dilatation. SPLEEN: No acute abnormality. PANCREAS: Chronic pancreatic atrophy. ADRENAL GLANDS: No acute abnormality. KIDNEYS, URETERS AND BLADDER: Symmetric renal enhancement and contrast excretion. No stones in the kidneys or ureters. No hydronephrosis. No perinephric or periureteral stranding. Urinary bladder is partially obscured by pelvis streak artifact. GI AND BOWEL: Stomach demonstrates no acute abnormality. Extensive chronic postoperative changes to the stomach and small bowel appear to be prior gastric sleeve, with a mid small bowel  anastomosis, and also the proximal duodenum appears chronically bypassed to more distal small bowel loops, better demonstrated  with the oral contrast exam previously. Decompressed stomach. The distal duodenum c loop and proximal small bowel are decompressed. The proximal anastomotic loop with the duodenum is decompressed. Downstream side to side small bowel anastomosis in the mid abdomen with regional gas and fluid distended loops but there seems to be a gradual transition. The terminal ileum is decompressed aside from flocculated material. Retained low density stool in the large bowel from the splenic flexure distally. Decompressed transverse colon. Mild retained stool in the right colon. Evidence of prior appendectomy. Chronic surgical clips in the gastroesophonic ligament. Partial small bowel obstruction or small bowel ileus would be difficult to exclude. No inflamed bowel loops. PERITONEUM AND RETROPERITONEUM: No ascites. No free air. No free fluid. VASCULATURE: Aorta is normal in caliber. Major arterial structures in the abdomen and pelvis, portal venous system appear patent and normal. Evidence also of calcified coronary artery atherosclerosis on series 3 image 1. LYMPH NODES: No lymphadenopathy. REPRODUCTIVE ORGANS: No acute abnormality. BONES AND SOFT TISSUES: Partially visible thoracic spinal cord stimulator is chronic and present on previous exam. Chronic multilevel lumbar spinal decompression and fusion with extension since the CT last year. Chronic bilateral hip arthroplasty. Associated streak artifact in the pelvis. No acute osseous abnormality. Chronic postoperative changes to the ventral abdominal wall with rectus muscle diastasis but no ventral abdominal hernia. IMPRESSION: 1. Chronic postoperative changes to the stomach and small bowel, appears to be a distal gastric or a proximal duodenal bypass. Clustered gas and fluid containing small bowel in the mid abdomen is mildly dilated, but with no abrupt  transition. A Partial small bowel obstruction or small bowel ileus would be difficult to exclude. No inflamed bowel. And redundant large bowel with low density retained stool. 2. No other acute or inflammatory process identified in the abdomen and pelvis. 3. Evidence of Coronary artery atherosclerosis. Other chronic postoperative changes including Extensive previous spine surgery and chronic spinal stimulator. Electronically signed by: Helayne Hurst MD 04/26/2024 12:25 PM EST RP Workstation: [id number]   Assessment/Plan pSBO 53 y/o F with history of multiple abdominal surgeries including open duodenal switch over 20 years ago who presents with 2 weeks of worsening abdominal pain, nausea, and 2 episodes of emesis. Her history, chart, labs, and imaging have all been personally reviewed. She is hemodynamically stable. CBC with WBC 3.1 and normocytic anemia. CMP overall unremarkable, alk phos 160. CT scan shows air-fluid levels of the small bowel in the mid abdomen without an abrupt transition point. There is no swirling or evidence of internal hernia on the CT. No emergent role for surgery at present. -PO trial -we discussed possible diagnostic laparoscopy given switch anatomy and concern for dilation which is within the BP limb  FEN - clear liquids tonight VTE - SCD's, hold pradaxa, ok for VTE ppx ID - none indicated from surgical standpoint Admit - [facility] service for management MMP  Iron-deficiency anemia GERD Asthma DVT on pradaxa  HTN CAD Chronic back pain, chronic neck pain - followed at pain clinic; as on note from 03/22/24: flexeril, robaxin, Celebrex, gabapentin, tramadol TID PRN   I reviewed nursing notes, ED provider notes, hospitalist notes, last 24 h vitals and pain scores, last 48 h intake and output, last 24 h labs and trends, and last 24 h imaging results.  Herlene Bureau, MD [facility] 04/26/2024, 2:11 PM Please see Amion for pager number during day hours 7:00am-4:30pm  or 7:00am -11:30am on weekends

## 2024-04-26 NOTE — ED Notes (Signed)
 Repot given to the Floor RN.[email or web address]

## 2024-04-26 NOTE — Plan of Care (Signed)
 53 year old F with PMH of duodenal switch, cholecystectomy, appendectomy, CAD, TAVR, palpitation, anxiety, depression, osteoarthritis, DVT on Pradaxa, cervical spine fusion, lumbar laminectomy and pancreatic insufficiency presented to [facility] with 3 weeks of left flank pain that has acutely gotten worse over the last few days.  She also had associated nausea and vomiting.  In ED, stable vitals.  Bicarb 19.  AG 9.  WBC 3.1.  Hgb 8.9 (14.2 in 06/2022).  Hemoccult negative.  UA and pregnancy test negative.  CT abdomen and pelvis raise concern for partial SBO/ileus.  EDP discussed case with on-call surgery who recommended admission to [facility] for bariatric surgery evaluation, n.p.o. Okay to stay without NGT as long as she is not vomiting.  Surgery to see patient in consultation when she arrives.  Accepted to [facility].

## 2024-04-26 NOTE — H&P (Signed)
 History and Physical    Patient: Renee Joseph FMW:[id number] DOB: [birth year] DOA: 04/26/2024 DOS: the patient was seen and examined on 04/26/2024 PCP: Patrcia Lynwood RAMAN, MD  Patient coming from: Home/[facility]  Chief Complaint: Left Flank Pain Chief Complaint  Patient presents with   Flank Pain   HPI: Renee Joseph is a 53 y.o. 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  Acutely worsening over last few days.  Patient reports decreased bowel movements and flatus.  In the ED, temperature 98.4 F, HR 97, RR 16, BP 116/73, SpO2 98% on room air.  WBC 3.1, hemoglobin 8.9, platelet count 158.  Sodium 140, potassium 4.1, chloride 113, CO2 19, glucose 76, BUN 8, creatinine 0.73.  Lipase 19.  AST 33, ALT 16, total Ruben 0.5.  hCG negative.  Urinalysis unrevealing.  FOBT negative.  CT abdomen/pelvis with contrast with chronic postoperative changes to the stomach and small bowel, distal gastric or proximal duodenal bypass, clustered gas and fluid containing small bowel in the mid abdomen mildly dilated with no abrupt transition consistent with partial small bowel obstruction versus small bowel ileus, no inflamed bowel, noted redundant large bowel with low density retained stool, no other acute or other inflammatory process identified in the abdomen/pelvis.  EDP consulted general surgery who recommended admission to [facility] for bariatric surgery evaluation; okay to stay without NGT as she is not currently vomiting.  TRH consulted for  admission and patient was transferred to [facility].    Review of Systems: As mentioned in the history of present illness. All other systems reviewed and are negative. Past Medical History:  Diagnosis Date   CAD (coronary artery disease)    Chronic venous hypertension (idiopathic) with inflammation of right lower extremity    Chronic venous insufficiency    DVT (deep venous thrombosis) (HCC)    Neuropathy    Syncope, vasovagal    Tricuspid valve regurgitation    Valvular heart disease    Past Surgical History:  Procedure Laterality Date   APPENDECTOMY     BACK SURGERY     CARPAL TUNNEL RELEASE     GALLBLADDER SURGERY     KNEE SURGERY Bilateral    LAMINECTOMY     SLEEVE GASTROPLASTY     THYROIDECTOMY     TOTAL HIP ARTHROPLASTY     Social History:  reports that she has never smoked. She has never used smokeless tobacco. She reports that she does not drink alcohol and does not use drugs.  Allergies  Allergen Reactions   Apixaban Hives, Itching, Other (See Comments) and Cough    Confusion, Dizziness = mental status changes also     Crab Extract Anaphylaxis and Other (See Comments)    Only crab, but can eat all other shellfish   Ketorolac Hives and Itching   Pregabalin Itching   Rivaroxaban Hives, Itching and Other (See Comments)    Confusion, Dizziness, Mental Status Changes also   Sulfa Antibiotics Anaphylaxis   Erythromycin Base Hives   Latex Hives and Other (See Comments)    Powder inside the gloves- Not latex but the powder in the gloves   Nabumetone  Hives   Penicillins Hives   Technetium Tc 50m Medronate Nausea Only and Other (See Comments)    Dizziness (Technetium Tc 50m medronate injection is used to help your doctor see an image of your bones to help diagnose bone problems. Technetium Tc 50m medronate injection is a radiopharmaceutical.)   Wound Dressing Adhesive Hives and Other (See Comments)    Plastic tape-  Band-Aids are okay., Plastic tape  irritates, paper tape is preferred    Family History  Problem Relation Age of Onset   Hypertension Mother    Diabetes Mother    Hypertension Father    Stroke Father    Heart disease Brother     Prior to Admission medications   Medication Sig Start Date End Date Taking? Authorizing Provider  EMBECTA PEN NEEDLE NANO 2 GEN 32G X 4 MM MISC  04/24/24  Yes [provider]  omeprazole (PRILOSEC) 40 MG capsule Take 40 mg by mouth 2 (two) times daily. 04/05/24  Yes [provider]  tirzepatide (ZEPBOUND) 12.5 MG/0.5ML Pen Inject 12.5 mg into the skin once a week. 04/19/24  Yes [provider]  TYMLOS 3120 MCG/1.56ML SOPN Inject 80 mcg into the skin daily. 03/09/24  Yes [provider]  ZEPBOUND 10 MG/0.5ML Pen Inject 10 mg into the skin once a week. 02/13/24  Yes [provider]  ALPRAZolam (XANAX) 0.5 MG tablet Take 0.5 mg by mouth every morning. 06/03/21   [provider]  celecoxib (CELEBREX) 200 MG capsule Take 200 mg by mouth 2 (two) times daily. 10/06/23   [provider]  CREON 36000-114000 units CPEP capsule Take 36,000 Units by mouth. 06/03/23   [provider]  cyclobenzaprine (FLEXERIL) 10 MG tablet Take 10 mg by mouth 3 (three) times daily. 10/20/21   [provider]  dabigatran (PRADAXA) 150 MG CAPS capsule Take 150 mg by mouth 2 (two) times daily.    [provider]  EPINEPHrine 0.3 mg/0.3 mL IJ SOAJ injection 1 pre-filled pen syringe IM PRN 10/15/21   [provider]  famotidine  (PEPCID ) 40 MG tablet Take 40 mg by mouth daily. 05/23/23   [provider]  FEROSUL 325 (65 Fe) MG tablet Take 325 mg by mouth daily. 10/10/21   [provider]  gabapentin (NEURONTIN) 300 MG capsule Take 600 mg by mouth 3 (three) times daily. 10/14/21   [provider]  meloxicam  (MOBIC ) 15 MG tablet Take 1 tablet (15 mg total) by mouth daily. 08/18/22   Tobie Franky SQUIBB, DPM  methocarbamol  (ROBAXIN) 750 MG tablet Take 750 mg by mouth 2 (two) times daily.    [provider]  metoprolol  tartrate (LOPRESSOR ) 25 MG tablet Take 1.5 tablets (37.5 mg total) by mouth 2 (two) times daily. 04/19/24   Croitoru, Mihai, MD  naloxone [facility]) nasal spray 4 mg/0.1 mL Place 1 spray into the nose. 04/16/23   [provider]  omeprazole (PRILOSEC) 20 MG capsule Take 20 mg by mouth daily.    [provider]  ondansetron  (ZOFRAN -ODT) 4 MG disintegrating tablet 4mg  ODT q4 hours prn nausea/vomit 05/23/21   Floyd, Dan, DO  pantoprazole (PROTONIX) 40 MG tablet Take 40 mg by mouth daily. 05/05/21   [provider]  sucralfate (CARAFATE) 1 g tablet Take 1 g by mouth 4 (four) times daily -  with meals and at bedtime.    [provider]  traMADol (ULTRAM) 50 MG tablet Take 50 mg by mouth every 8 (eight) hours as needed for  severe pain. 06/09/22   [provider]  venlafaxine XR (EFFEXOR-XR) 75 MG 24 hr capsule Take 225 mg by mouth daily. 10/15/21   [provider]  Vitamin D, Ergocalciferol, (DRISDOL) 1.25 MG (50000 UNIT) CAPS capsule Take 50,000 Units by mouth once a week.    [provider]  ZEPBOUND 2.5 MG/0.5ML Pen Inject 2.5 mg into the skin. 09/28/23   [provider]  zolpidem (AMBIEN) 10 MG tablet Take 10 mg by mouth at bedtime as needed. 06/05/21   [provider]    Physical Exam: Vitals:   04/26/24 1426 04/26/24 1440 04/26/24 1538 04/26/24 1744  BP:  104/66 96/68 98/70   Pulse:  65 66 67  Resp:  16 16 16   Temp: 98.4 F (36.9 C)  98.2 F (36.8 C) 97.6 F (36.4 C)  TempSrc: Oral  Oral Oral  SpO2:  98% 99% 100%   GEN: 53 yo female in NAD, alert and oriented x 3, chronically ill appearance HEENT: NCAT, PERRL, EOMI, sclera clear, MMM PULM: CTAB w/o wheezes/crackles, normal respiratory effort on room air CV: RRR w/o M/G/R GI: abd soft, NTND, + BS MSK: no peripheral edema, moves all extremities  independently NEURO: No focal neurological deficit PSYCH: normal mood/affect Integumentary: No concerning rashes/lesions/wounds no exposed skin surfaces   Assessment and Plan:  Partial small bowel obstruction Patient presenting with 2-3-week history of progressive left flank pain associate with nausea and vomiting.  History of multiple intra-abdominal surgeries including appendectomy, cholecystectomy, duodenal switch.  Patient is afebrile without leukocytosis.  CT abdomen/pelvis with findings concerning for partial small bowel obstruction versus ileus. -- General Surgery following, appreciate assistance -- No need for NG tube at this time per CCS as not actively vomiting -- Clear liquid diet, further advancement per general surgery -- Holding Pradaxa -- Further per general surgery  HTN -- Metoprolol  titrate 37.5 mg p.o. nightly  CAD  Hx  DVT  -- Hold home Pradaxa for now on Pradaxa  IDA -- Hemoglobin stable, 8.9. -- Hold home iron supplement for now  GERD -- Hold home omeprazole and famotidine  for now  Asthma -- Albuterol neb as needed shortness of breath/wheezing  Chronic back/neck pain s/p cervical spine fusion/lumbar laminectomy -- Tramadol 50 mg p.o. every 6 hours.  Moderate pain -- Morphine  2 mg IV q3h PRN severe pain not relieved with oral medication -- Continue home Flexeril, Robaxin -- Outpatient follow-up with neurosurgery  Pancreatic insufficiency -- Hold home Creon for now until further diet advancement  vitamin D deficiency -- Hold home vitamin D supplement  Tobacco use disorder Counseled on need for complete cessation/abstinence as this contributes to her chronic comorbidities.  Esophageal stricture requiring dilation -- Outpatient follow-up with GI  Insomnia -- Ambien qHS   Advance Care Planning:   Code Status: Full Code   Consults: CCS  Family Communication: Family present at bedside  Severity of Illness: The appropriate patient status for  this patient is OBSERVATION. Observation status is judged to be reasonable and necessary in order to provide the required intensity of service to ensure the patient's safety. The patient's presenting symptoms, physical exam findings, and initial radiographic and laboratory data in the context of their medical condition is felt to place them at decreased risk for further clinical deterioration. Furthermore, it is anticipated that the patient will be medically stable for discharge from the hospital within 2 midnights of admission.   Author: Camellia PARAS Nichelle Renwick, DO 04/26/2024 6:24 PM  For on call review [email or web address].

## 2024-04-26 NOTE — ED Provider Notes (Signed)
  EMERGENCY DEPARTMENT AT [facility] Provider Note   CSN: [id number] Arrival date & time: 04/26/24  1005     Patient presents with: Flank Pain   Renee Joseph is a 53 y.o. female.   Patient is a 53 year old female with past history of CAD, GERD, chronic pain presenting to the emergency department with left-sided flank pain.  Patient states that she has had a dull ache in her left side for the last 2 weeks.  She states that initially was coming and going but over the last several days has been constant and become more severe.  She states that she has associated nausea and has vomited twice in the last week.  She states that the pain does not get any better or worse with eating or drinking and has been taking her PPI.  She states that she has had some diarrhea, denies any black or bloody stool.  Denies any fever.  Denies any dysuria or hematuria but does report that she started her period last week for the first time in a year.   Flank Pain       Prior to Admission medications   Medication Sig Start Date End Date Taking? Authorizing Provider  ALPRAZolam (XANAX) 0.5 MG tablet Take 0.5 mg by mouth every morning. 06/03/21   [provider]  celecoxib (CELEBREX) 200 MG capsule Take 200 mg by mouth 2 (two) times daily. 10/06/23   [provider]  CREON 36000-114000 units CPEP capsule Take 36,000 Units by mouth. 06/03/23   [provider]  cyclobenzaprine (FLEXERIL) 10 MG tablet Take 10 mg by mouth 3 (three) times daily. 10/20/21   [provider]  dabigatran (PRADAXA) 150 MG CAPS capsule Take 150 mg by mouth 2 (two) times daily.    [provider]  EPINEPHrine 0.3 mg/0.3 mL IJ SOAJ injection 1 pre-filled pen syringe IM PRN 10/15/21   [provider]  famotidine  (PEPCID ) 40 MG tablet Take 40 mg by mouth daily. 05/23/23   [provider]  FEROSUL 325 (65 Fe) MG tablet Take 325 mg by mouth daily. 10/10/21    [provider]  gabapentin (NEURONTIN) 300 MG capsule Take 600 mg by mouth 3 (three) times daily. 10/14/21   [provider]  meloxicam  (MOBIC ) 15 MG tablet Take 1 tablet (15 mg total) by mouth daily. 08/18/22   Tobie Franky SQUIBB, DPM  methocarbamol (ROBAXIN) 750 MG tablet Take 750 mg by mouth 2 (two) times daily.    [provider]  metoprolol  tartrate (LOPRESSOR ) 25 MG tablet Take 1.5 tablets (37.5 mg total) by mouth 2 (two) times daily. 04/19/24   Croitoru, Mihai, MD  naloxone [facility]) nasal spray 4 mg/0.1 mL Place 1 spray into the nose. 04/16/23   [provider]  omeprazole (PRILOSEC) 20 MG capsule Take 20 mg by mouth daily.    [provider]  ondansetron  (ZOFRAN -ODT) 4 MG disintegrating tablet 4mg  ODT q4 hours prn nausea/vomit 05/23/21   Floyd, Dan, DO  pantoprazole (PROTONIX) 40 MG tablet Take 40 mg by mouth daily. 05/05/21   [provider]  sucralfate (CARAFATE) 1 g tablet Take 1 g by mouth 4 (four) times daily -  with meals and at bedtime.    [provider]  traMADol (ULTRAM) 50 MG tablet Take 50 mg by mouth every 8 (eight) hours as needed for severe pain. 06/09/22   [provider]  venlafaxine XR (EFFEXOR-XR) 75 MG 24 hr capsule Take 225 mg by  mouth daily. 10/15/21   [provider]  Vitamin D, Ergocalciferol, (DRISDOL) 1.25 MG (50000 UNIT) CAPS capsule Take 50,000 Units by mouth once a week.    [provider]  ZEPBOUND 2.5 MG/0.5ML Pen Inject 2.5 mg into the skin. 09/28/23   [provider]  zolpidem (AMBIEN) 10 MG tablet Take 10 mg by mouth at bedtime as needed. 06/05/21   [provider]    Allergies: Crab extract, Other, Sulfa antibiotics, Apixaban, Erythromycin base, Ketorolac, Latex, Nabumetone, Pedi-pre tape spray [wound dressing adhesive], Penicillins, Pregabalin, Rivaroxaban, and Toradol [ketorolac tromethamine]    Review of Systems  Genitourinary:  Positive for flank pain.     Updated Vital Signs BP 100/67 (BP Location: Right Arm)   Pulse 72   Temp 98.4 F (36.9 C) (Oral)   Resp 18   SpO2 99%   Physical Exam Vitals and nursing note reviewed.  Constitutional:      General: She is not in acute distress.    Appearance: Normal appearance.  HENT:     Head: Normocephalic and atraumatic.     Nose: Nose normal.     Mouth/Throat:     Mouth: Mucous membranes are moist.     Pharynx: Oropharynx is clear.  Eyes:     Extraocular Movements: Extraocular movements intact.     Conjunctiva/sclera: Conjunctivae normal.  Cardiovascular:     Rate and Rhythm: Normal rate and regular rhythm.     Heart sounds: Normal heart sounds.  Pulmonary:     Effort: Pulmonary effort is normal.     Breath sounds: Normal breath sounds.  Abdominal:     General: Abdomen is flat.     Palpations: Abdomen is soft.     Tenderness: There is abdominal tenderness (LUQ/LLQ). There is no guarding or rebound.  Musculoskeletal:        General: Normal range of motion.     Cervical back: Normal range of motion.  Skin:    General: Skin is warm and dry.     Findings: No rash.  Neurological:     General: No focal deficit present.     Mental Status: She is alert and oriented to person, place, and time.  Psychiatric:        Mood and Affect: Mood normal.        Behavior: Behavior normal.     (all labs ordered are listed, but only abnormal results are displayed) Labs Reviewed  COMPREHENSIVE METABOLIC PANEL WITH GFR - Abnormal; Notable for the following components:      Result Value   Chloride 113 (*)    CO2 19 (*)    Calcium 8.2 (*)    Total Protein 5.3 (*)    Albumin 3.3 (*)    Alkaline Phosphatase 160 (*)    All other components within normal limits  CBC WITH DIFFERENTIAL/PLATELET - Abnormal; Notable for the following components:   WBC 3.1 (*)    RBC 3.47 (*)    Hemoglobin 8.9 (*)    HCT 29.1 (*)    MCH 25.6 (*)    RDW 18.2 (*)    All other components within normal limits   URINALYSIS, ROUTINE W REFLEX MICROSCOPIC - Abnormal; Notable for the following components:   Hgb urine dipstick SMALL (*)    All other components within normal limits  LIPASE, BLOOD  PREGNANCY, URINE  OCCULT BLOOD X 1 CARD TO LAB, STOOL    EKG: None  Radiology: CT ABDOMEN PELVIS W CONTRAST Result Date: 04/26/2024 EXAM:  CT ABDOMEN AND PELVIS WITH CONTRAST 04/26/2024 11:37:04 AM TECHNIQUE: CT of the abdomen and pelvis was performed with the administration of intravenous contrast. Multiplanar reformatted images are provided for review. Automated exposure control, iterative reconstruction, and/or weight-based adjustment of the mA/kV was utilized to reduce the radiation dose to as low as reasonably achievable. COMPARISON: CT abdomen and pelvis 04/05/2023. CLINICAL HISTORY: 53 year old female with LLQ abdominal pain. FINDINGS: LOWER CHEST: No acute abnormality. Normal heart size. No pericardial or pleural effusion. Lung bases are clear. Incidental breast implants. LIVER: Chronic hepatic steatosis. GALLBLADDER AND BILE DUCTS: Cholecystectomy. No biliary ductal dilatation. SPLEEN: No acute abnormality. PANCREAS: Chronic pancreatic atrophy. ADRENAL GLANDS: No acute abnormality. KIDNEYS, URETERS AND BLADDER: Symmetric renal enhancement and contrast excretion. No stones in the kidneys or ureters. No hydronephrosis. No perinephric or periureteral stranding. Urinary bladder is partially obscured by pelvis streak artifact. GI AND BOWEL: Stomach demonstrates no acute abnormality. Extensive chronic postoperative changes to the stomach and small bowel appear to be prior gastric sleeve, with a mid small bowel anastomosis, and also the proximal duodenum appears chronically bypassed to more distal small bowel loops, better demonstrated with the oral contrast exam previously. Decompressed stomach. The distal duodenum c loop and proximal small bowel are decompressed. The proximal anastomotic loop with the duodenum is  decompressed. Downstream side to side small bowel anastomosis in the mid abdomen with regional gas and fluid distended loops but there seems to be a gradual transition. The terminal ileum is decompressed aside from flocculated material. Retained low density stool in the large bowel from the splenic flexure distally. Decompressed transverse colon. Mild retained stool in the right colon. Evidence of prior appendectomy. Chronic surgical clips in the gastroesophonic ligament. Partial small bowel obstruction or small bowel ileus would be difficult to exclude. No inflamed bowel loops. PERITONEUM AND RETROPERITONEUM: No ascites. No free air. No free fluid. VASCULATURE: Aorta is normal in caliber. Major arterial structures in the abdomen and pelvis, portal venous system appear patent and normal. Evidence also of calcified coronary artery atherosclerosis on series 3 image 1. LYMPH NODES: No lymphadenopathy. REPRODUCTIVE ORGANS: No acute abnormality. BONES AND SOFT TISSUES: Partially visible thoracic spinal cord stimulator is chronic and present on previous exam. Chronic multilevel lumbar spinal decompression and fusion with extension since the CT last year. Chronic bilateral hip arthroplasty. Associated streak artifact in the pelvis. No acute osseous abnormality. Chronic postoperative changes to the ventral abdominal wall with rectus muscle diastasis but no ventral abdominal hernia. IMPRESSION: 1. Chronic postoperative changes to the stomach and small bowel, appears to be a distal gastric or a proximal duodenal bypass. Clustered gas and fluid containing small bowel in the mid abdomen is mildly dilated, but with no abrupt transition. A Partial small bowel obstruction or small bowel ileus would be difficult to exclude. No inflamed bowel. And redundant large bowel with low density retained stool. 2. No other acute or inflammatory process identified in the abdomen and pelvis. 3. Evidence of Coronary artery atherosclerosis. Other  chronic postoperative changes including Extensive previous spine surgery and chronic spinal stimulator. Electronically signed by: Helayne Hurst MD 04/26/2024 12:25 PM EST RP Workstation: [id number]     Procedures   Medications Ordered in the ED  lidocaine (LIDODERM) 5 % 1-3 patch (1 patch Transdermal Patch Applied 04/26/24 1252)  morphine  (PF) 4 MG/ML injection 4 mg (4 mg Intravenous Given 04/26/24 1045)  ondansetron  (ZOFRAN ) injection 4 mg (4 mg Intravenous Given 04/26/24 1045)  iohexol  (OMNIPAQUE ) 300 MG/ML solution 100 mL (  80 mLs Intravenous Contrast Given 04/26/24 1128)  morphine  (PF) 4 MG/ML injection 4 mg (4 mg Intravenous Given 04/26/24 1253)    Clinical Course as of 04/26/24 1323  Wed Apr 26, 2024  1102 Mildly worsening anemia from last outpatient labs in Aug 2025, similar thrombocytopenia. Will perform hemoccult to evaluate for possible GIB. No report of black or bloody stool per patient. [VK]  1228 CTAP with possible partial SBO vs. Ileus. Will consult general surgery for recommendations. [VK]  1305 I spoke with Ozell PA with general surgery who is going to review imaging with bariatrics for recommendations. Patient has history of duodenal switch procedure.  [VK]  1318 General surgery recommends admission to hospitalist for observation. Keep NPO for now, does not need NGT if not actively vomiting. [VK]    Clinical Course User Index [VK] Kingsley, Jakayden Cancio K, DO                                 Medical Decision Making This patient presents to the ED with chief complaint(s) of L flank pain with pertinent past medical history of CAD, GERD, chronic pain which further complicates the presenting complaint. The complaint involves an extensive differential diagnosis and also carries with it a high risk of complications and morbidity.    The differential diagnosis includes gastritis, gastroenteritis, diverticulitis, pyelonephritis, nephrolithiasis, PUD  Additional history  obtained: Additional history obtained from N/A Records reviewed Care Everywhere/External Records  ED Course and Reassessment: On patient's arrival she is hemodynamically stable in no acute distress.  Will have labs and urine as well as CT imaging performed to evaluate for etiology of her symptoms.  Was given pain and nausea control and will be closely reassessed.  Independent labs interpretation:  The following labs were independently interpreted: mildly worsening anemia from baseline, alk phos and leukopenia at baseline  Independent visualization of imaging: - I independently visualized the following imaging with scope of interpretation limited to determining acute life threatening conditions related to emergency care: CTAP, which revealed partial SBO vs ileus  Consultation: - Consulted or discussed management/test interpretation w/ external professional: general surgery, hospitalist  Consideration for admission or further workup: patient requires admission for observation Social Determinants of health: N/A    Amount and/or Complexity of Data Reviewed Labs: ordered. Radiology: ordered.  Risk Prescription drug management. Decision regarding hospitalization.       Final diagnoses:  SBO (small bowel obstruction) [facility])    ED Discharge Orders     None          Ellouise Richerd POUR, DO 04/26/24 1323

## 2024-04-26 NOTE — ED Triage Notes (Signed)
 Patient states left sided flank pain for 3 weeks. Endorses intermittent nausea and diarrhea. Also endorses some dizziness.

## 2024-04-27 ENCOUNTER — Encounter (HOSPITAL_COMMUNITY): Payer: Self-pay | Admitting: Student

## 2024-04-27 DIAGNOSIS — K56609 Unspecified intestinal obstruction, unspecified as to partial versus complete obstruction: Secondary | ICD-10-CM | POA: Diagnosis not present

## 2024-04-27 LAB — CBC
HCT: 32.8 % — ABNORMAL LOW (ref 36.0–46.0)
Hemoglobin: 9.3 g/dL — ABNORMAL LOW (ref 12.0–15.0)
MCH: 25.1 pg — ABNORMAL LOW (ref 26.0–34.0)
MCHC: 28.4 g/dL — ABNORMAL LOW (ref 30.0–36.0)
MCV: 88.4 fL (ref 80.0–100.0)
Platelets: 174 K/uL (ref 150–400)
RBC: 3.71 MIL/uL — ABNORMAL LOW (ref 3.87–5.11)
RDW: 18.6 % — ABNORMAL HIGH (ref 11.5–15.5)
WBC: 2.9 K/uL — ABNORMAL LOW (ref 4.0–10.5)
nRBC: 0 % (ref 0.0–0.2)

## 2024-04-27 LAB — BASIC METABOLIC PANEL WITH GFR
Anion gap: 8 (ref 5–15)
BUN: 6 mg/dL (ref 6–20)
CO2: 19 mmol/L — ABNORMAL LOW (ref 22–32)
Calcium: 7.6 mg/dL — ABNORMAL LOW (ref 8.9–10.3)
Chloride: 111 mmol/L (ref 98–111)
Creatinine, Ser: 0.67 mg/dL (ref 0.44–1.00)
GFR, Estimated: >60 mL/min (ref >60)
Glucose, Bld: 63 mg/dL — ABNORMAL LOW (ref 70–99)
Potassium: 3.6 mmol/L (ref 3.5–5.1)
Sodium: 138 mmol/L (ref 135–145)

## 2024-04-27 NOTE — Progress Notes (Signed)
      Chief Complaint/Subjective: Persistent left sided abdominal pain but more musculoskeletal. Tolerated large amount of fluids without nausea or pain.  Objective: Vital signs in last 24 hours: Temp:  [97.6 F (36.4 C)-98.4 F (36.9 C)] 97.8 F (36.6 C) (11/06 0448) Pulse Rate:  [58-97] 67 (11/06 0448) Resp:  [16-18] 18 (11/06 0448) BP: (90-116)/(53-76) 90/64 (11/06 0448) SpO2:  [97 %-100 %] 98 % (11/06 0448) Weight:  [73 kg-79.6 kg] 79.6 kg (11/06 0646) Last BM Date : 04/26/24 Intake/Output from previous day: 11/05 0701 - 11/06 0700 In: 1712.8 [P.O.:1080; I.V.:632.8] Out: 300 [Urine:300]  PE: Gen: NAD Resp: nonlabored Card: RRR Abd: soft, NT  Lab Results:  Recent Labs    04/26/24 1706 04/27/24 0725  WBC 3.6* 2.9*  HGB 9.7* 9.3*  HCT 33.1* 32.8*  PLT 173 174   Recent Labs    04/26/24 1023 04/26/24 1706  NA 140  --   K 4.1  --   CL 113*  --   CO2 19*  --   GLUCOSE 76  --   BUN 8  --   CREATININE 0.73 0.70  CALCIUM 8.2*  --    No results for input(s): LABPROT, INR in the last 72 hours.    Component Value Date/Time   NA 140 04/26/2024 1023   NA 144 07/08/2022 1632   K 4.1 04/26/2024 1023   CL 113 (H) 04/26/2024 1023   CO2 19 (L) 04/26/2024 1023   GLUCOSE 76 04/26/2024 1023   BUN 8 04/26/2024 1023   BUN 9 07/08/2022 1632   CREATININE 0.70 04/26/2024 1706   CALCIUM 8.2 (L) 04/26/2024 1023   PROT 5.3 (L) 04/26/2024 1023   ALBUMIN 3.3 (L) 04/26/2024 1023   AST 33 04/26/2024 1023   ALT 16 04/26/2024 1023   ALKPHOS 160 (H) 04/26/2024 1023   BILITOT 0.5 04/26/2024 1023   GFRNONAA >60 04/26/2024 1706    Assessment/Plan Patient with switch anatomy who had concern for partial obstruction of BP limb on CT scan, tolerating liquids well without nausea    -advance diet -discharge if tolerates -follow up with Dr. Cara  FEN - carb mod diet VTE - lovenox ID - none indicated Disposition - ok to discharge home   LOS: 0 days   I reviewed last 24  h vitals and pain scores, last 48 h intake and output, last 24 h labs and trends, and last 24 h imaging results.  This care required moderate level of medical decision making.   Herlene Righter [facility] 04/27/2024, 8:42 AM Please see Amion for pager number during day hours 7:00am-4:30pm or 7:00am -11:30am on weekends

## 2024-04-27 NOTE — Discharge Summary (Signed)
 Physician Discharge Summary  Renee Joseph FMW:[id number] DOB: 1971/05/04 DOA: 04/26/2024  PCP: Patrcia Lynwood RAMAN, MD  Admit date: 04/26/2024 Discharge date: 04/27/2024  Admitted From: Home Disposition: Home  Recommendations for Outpatient Follow-up:  Follow up with PCP in 1-2 weeks Follow-up with general surgery, Dr. Cara as needed  Home Health: No Equipment/Devices: None  Discharge Condition: Stable CODE STATUS: Full code Diet recommendation:   History of present illness:  Renee Joseph is a 53 y.o. 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  Acutely worsening over last few days.  Patient reports decreased bowel movements and flatus.   In the ED, temperature 98.4 F, HR 97, RR 16, BP 116/73, SpO2 98% on room air.  WBC 3.1, hemoglobin 8.9, platelet count 158.  Sodium 140, potassium 4.1, chloride 113, CO2 19, glucose 76, BUN 8, creatinine 0.73.  Lipase 19.  AST 33, ALT 16, total Ruben 0.5.  hCG negative.  Urinalysis unrevealing.  FOBT negative.  CT abdomen/pelvis with contrast with chronic postoperative changes to the stomach and small bowel, distal gastric or proximal duodenal bypass, clustered gas and fluid containing small bowel in the mid abdomen mildly dilated with no abrupt transition consistent with partial small bowel obstruction versus small bowel ileus, no inflamed bowel, noted redundant large bowel with low density retained stool, no other acute or other inflammatory process identified in the abdomen/pelvis.  EDP consulted  general surgery who recommended admission to [facility] for bariatric surgery evaluation; okay to stay without NGT as she is not currently vomiting.  TRH consulted for admission and patient was transferred to [facility] course:  Partial small bowel obstruction Patient presenting with 2-3-week history of progressive left flank pain associate with nausea and vomiting.  History of multiple intra-abdominal surgeries including appendectomy, cholecystectomy, duodenal switch.  Patient is afebrile without leukocytosis.  CT abdomen/pelvis with findings concerning for partial small bowel obstruction versus ileus.  Patient was seen by general surgery, initially started on clear liquid diet and advanced with toleration.  General surgery signed off, recommend outpatient follow-up with general surgery, Dr. Cara as needed   HTN Metoprolol  titrate 37.5 mg p.o. nightly   CAD  Hx  DVT  Continue Pradaxa   IDA Hemoglobin stable, 9.7.  Continue iron supplementation   GERD Continue home omeprazole and famotidine    Asthma Stable, not requiring oxygen.   Chronic back/neck pain s/p cervical spine fusion/lumbar laminectomy Continue home tramadol, Robaxin, Flexeril.  Outpatient follow-up with neurosurgery.     Pancreatic insufficiency Continue Creon   vitamin D deficiency Can continue vitamin D supplement   Tobacco use disorder Counseled on need for complete cessation/abstinence as this contributes to her chronic comorbidities.   Esophageal stricture requiring dilation Outpatient follow-up with GI   Insomnia Ambien qHS  Discharge Diagnoses:  Principal Problem:   Small bowel obstruction [facility])    Discharge Instructions  Discharge Instructions     Call MD for:  difficulty breathing, headache or visual disturbances   Complete by: As directed    Call MD for:  extreme fatigue   Complete by: As directed    Call MD for:  persistant dizziness  or light-headedness   Complete  by: As directed    Call MD for:  persistant nausea and vomiting   Complete by: As directed    Call MD for:  severe uncontrolled pain   Complete by: As directed    Call MD for:  temperature >100.4   Complete by: As directed    Diet - low sodium heart healthy   Complete by: As directed    Increase activity slowly   Complete by: As directed       Allergies as of 04/27/2024       Reactions   Apixaban Hives, Itching, Other (See Comments), Cough   Confusion, Dizziness = mental status changes also   Crab Extract Anaphylaxis, Other (See Comments)   Only crab, but can eat all other shellfish   Ketorolac Hives, Itching   Pregabalin Itching   Rivaroxaban Hives, Itching, Other (See Comments)   Confusion, Dizziness, Mental Status Changes also   Sulfa Antibiotics Anaphylaxis   Erythromycin Base Hives   Latex Hives, Other (See Comments)   Powder inside the gloves- Not latex but the powder in the gloves   Nabumetone Hives   Penicillins Hives   Technetium Tc 52m Medronate Nausea Only, Other (See Comments)   Dizziness (Technetium Tc 52m medronate injection is used to help your doctor see an image of your bones to help diagnose bone problems. Technetium Tc 52m medronate injection is a radiopharmaceutical.)   Wound Dressing Adhesive Hives, Other (See Comments)   Plastic tape-  Band-Aids are okay., Plastic tape irritates, paper tape is preferred        Medication List     STOP taking these medications    meloxicam  15 MG tablet Commonly known as: MOBIC        TAKE these medications    ALPRAZolam 0.5 MG tablet Commonly known as: XANAX Take 0.5 mg by mouth daily as needed for anxiety or sleep.   celecoxib 200 MG capsule Commonly known as: CELEBREX Take 200 mg by mouth in the morning and at bedtime.   Creon 36000-114000 units Cpep capsule Generic drug: lipase/protease/amylase Take 36,000-72,000 Units by mouth See admin instructions. Take 36,000 units by mouth with lunch and  72,000 units with supper- IF TOLERATED   cyclobenzaprine 10 MG tablet Commonly known as: FLEXERIL Take 10 mg by mouth at bedtime as needed for muscle spasms.   dabigatran 150 MG Caps capsule Commonly known as: PRADAXA Take 150 mg by mouth 2 (two) times daily.   Embecta Pen Needle Nano 2 Gen 32G X 4 MM Misc Generic drug: Insulin Pen Needle   EPINEPHrine 0.3 mg/0.3 mL Soaj injection Commonly known as: EPI-PEN Inject 0.3 mg into the muscle as needed for anaphylaxis.   famotidine  40 MG tablet Commonly known as: PEPCID  Take 40 mg by mouth daily.   FeroSul 325 (65 Fe) MG tablet Generic drug: ferrous sulfate Take 325 mg by mouth daily with breakfast.   gabapentin 300 MG capsule Commonly known as: NEURONTIN Take 300-600 mg by mouth See admin instructions. Take 300 mg by mouth in the morning and 600 mg at bedtime   methocarbamol 750 MG tablet Commonly known as: ROBAXIN Take 750 mg by mouth See admin instructions. Take 750 mg by mouth in the morning and afternoon   metoprolol  tartrate 25 MG tablet Commonly known as: LOPRESSOR  Take 1.5 tablets (37.5 mg total) by mouth 2 (two) times daily. What changed: when to take this   naloxone 4 MG/0.1ML Liqd nasal spray kit Commonly known  as: NARCAN Place 1 spray into the nose.   omeprazole 40 MG capsule Commonly known as: PRILOSEC Take 40 mg by mouth in the morning and at bedtime.   ondansetron  4 MG disintegrating tablet Commonly known as: ZOFRAN -ODT 4mg  ODT q4 hours prn nausea/vomit What changed:  how much to take how to take this when to take this reasons to take this additional instructions   traMADol 50 MG tablet Commonly known as: ULTRAM Take 50 mg by mouth every 8 (eight) hours.   Tymlos 3120 MCG/1.56ML Sopn Generic drug: Abaloparatide Inject 80 mcg into the skin daily.   Vitamin D (Ergocalciferol) 1.25 MG (50000 UNIT) Caps capsule Commonly known as: DRISDOL Take 50,000 Units by mouth every Sunday.   Vitamin D-3  125 MCG (5000 UT) Tabs Take 5,000 Units by mouth daily.   Zepbound 10 MG/0.5ML Pen Generic drug: tirzepatide Inject 10 mg into the skin every Saturday.   zolpidem 10 MG tablet Commonly known as: AMBIEN Take 10 mg by mouth at bedtime.        Follow-up Information     Patrcia Lynwood RAMAN, MD. Schedule an appointment as soon as possible for a visit in 1 week(s).   Specialty: Family Medicine Contact information: [address] [place] [address] [phone]         Cara Arbour, MD. Schedule an appointment as soon as possible for a visit.   Specialty: Surgery Contact information: [address] [place] [address] [phone]                Allergies  Allergen Reactions   Apixaban Hives, Itching, Other (See Comments) and Cough    Confusion, Dizziness = mental status changes also     Crab Extract Anaphylaxis and Other (See Comments)    Only crab, but can eat all other shellfish   Ketorolac Hives and Itching   Pregabalin Itching   Rivaroxaban Hives, Itching and Other (See Comments)    Confusion, Dizziness, Mental Status Changes also   Sulfa Antibiotics Anaphylaxis   Erythromycin Base Hives   Latex Hives and Other (See Comments)    Powder inside the gloves- Not latex but the powder in the gloves   Nabumetone Hives   Penicillins Hives   Technetium Tc 53m Medronate Nausea Only and Other (See Comments)    Dizziness (Technetium Tc 53m medronate injection is used to help your doctor see an image of your bones to help diagnose bone problems. Technetium Tc 53m medronate injection is a radiopharmaceutical.)   Wound Dressing Adhesive Hives and Other (See Comments)    Plastic tape-  Band-Aids are okay., Plastic tape irritates, paper tape is preferred    Consultations: General surgery   Procedures/Studies: CT ABDOMEN PELVIS W CONTRAST Result Date: 04/26/2024 EXAM: CT ABDOMEN AND PELVIS WITH CONTRAST 04/26/2024 11:37:04 AM TECHNIQUE: CT of  the abdomen and pelvis was performed with the administration of intravenous contrast. Multiplanar reformatted images are provided for review. Automated exposure control, iterative reconstruction, and/or weight-based adjustment of the mA/kV was utilized to reduce the radiation dose to as low as reasonably achievable. COMPARISON: CT abdomen and pelvis 04/05/2023. CLINICAL HISTORY: 53 year old female with LLQ abdominal pain. FINDINGS: LOWER CHEST: No acute abnormality. Normal heart size. No pericardial or pleural effusion. Lung bases are clear. Incidental breast implants. LIVER: Chronic hepatic steatosis. GALLBLADDER AND BILE DUCTS: Cholecystectomy. No biliary ductal dilatation. SPLEEN: No acute abnormality. PANCREAS: Chronic pancreatic atrophy. ADRENAL GLANDS: No acute abnormality. KIDNEYS, URETERS AND BLADDER: Symmetric  renal enhancement and contrast excretion. No stones in the kidneys or ureters. No hydronephrosis. No perinephric or periureteral stranding. Urinary bladder is partially obscured by pelvis streak artifact. GI AND BOWEL: Stomach demonstrates no acute abnormality. Extensive chronic postoperative changes to the stomach and small bowel appear to be prior gastric sleeve, with a mid small bowel anastomosis, and also the proximal duodenum appears chronically bypassed to more distal small bowel loops, better demonstrated with the oral contrast exam previously. Decompressed stomach. The distal duodenum c loop and proximal small bowel are decompressed. The proximal anastomotic loop with the duodenum is decompressed. Downstream side to side small bowel anastomosis in the mid abdomen with regional gas and fluid distended loops but there seems to be a gradual transition. The terminal ileum is decompressed aside from flocculated material. Retained low density stool in the large bowel from the splenic flexure distally. Decompressed transverse colon. Mild retained stool in the right colon. Evidence of prior  appendectomy. Chronic surgical clips in the gastroesophonic ligament. Partial small bowel obstruction or small bowel ileus would be difficult to exclude. No inflamed bowel loops. PERITONEUM AND RETROPERITONEUM: No ascites. No free air. No free fluid. VASCULATURE: Aorta is normal in caliber. Major arterial structures in the abdomen and pelvis, portal venous system appear patent and normal. Evidence also of calcified coronary artery atherosclerosis on series 3 image 1. LYMPH NODES: No lymphadenopathy. REPRODUCTIVE ORGANS: No acute abnormality. BONES AND SOFT TISSUES: Partially visible thoracic spinal cord stimulator is chronic and present on previous exam. Chronic multilevel lumbar spinal decompression and fusion with extension since the CT last year. Chronic bilateral hip arthroplasty. Associated streak artifact in the pelvis. No acute osseous abnormality. Chronic postoperative changes to the ventral abdominal wall with rectus muscle diastasis but no ventral abdominal hernia. IMPRESSION: 1. Chronic postoperative changes to the stomach and small bowel, appears to be a distal gastric or a proximal duodenal bypass. Clustered gas and fluid containing small bowel in the mid abdomen is mildly dilated, but with no abrupt transition. A Partial small bowel obstruction or small bowel ileus would be difficult to exclude. No inflamed bowel. And redundant large bowel with low density retained stool. 2. No other acute or inflammatory process identified in the abdomen and pelvis. 3. Evidence of Coronary artery atherosclerosis. Other chronic postoperative changes including Extensive previous spine surgery and chronic spinal stimulator. Electronically signed by: Helayne Hurst MD 04/26/2024 12:25 PM EST RP Workstation: [id number]     Subjective: Patient seen examined bedside, lying in bed.  Tolerating diet.  Seen by general surgery and okay for discharge home.  No other questions concerns at this time.  Denies headache, no  dizziness, no chest pain, no palpitations, no shortness of breath, no abdominal pain, no fever/chills/night sweats, no nausea/vomit/diarrhea, no focal weakness, no fatigue, no paresthesias.  No acute events overnight per nursing staff.  Discharge Exam: Vitals:   04/27/24 0130 04/27/24 0448  BP: 100/60 90/64  Pulse: (!) 58 67  Resp: 18 18  Temp: 97.6 F (36.4 C) 97.8 F (36.6 C)  SpO2: 99% 98%   Vitals:   04/26/24 2214 04/27/24 0130 04/27/24 0448 04/27/24 0646  BP:  100/60 90/64   Pulse:  (!) 58 67   Resp:  18 18   Temp:  97.6 F (36.4 C) 97.8 F (36.6 C)   TempSrc:  Oral Oral   SpO2:  99% 98%   Weight: 73 kg   79.6 kg  Height:    5' 2.5 (1.588 m)  Physical Exam: GEN: NAD, alert and oriented x 3,  chronically ill appearance HEENT: NCAT, PERRL, EOMI, sclera clear, MMM PULM: CTAB w/o wheezes/crackles, normal respiratory effort, room air CV: RRR w/o M/G/R GI: abd soft, NTND, + BS MSK: no peripheral edema, moves all extremity independently NEURO: No focal neurological deficit PSYCH: normal mood/affect Integumentary: dry/intact, no rashes or wounds    The results of significant diagnostics from this hospitalization (including imaging, microbiology, ancillary and laboratory) are listed below for reference.     Microbiology: No results found for this or any previous visit (from the past 240 hours).   Labs: BNP (last 3 results) No results for input(s): BNP in the last 8760 hours. Basic Metabolic Panel: Recent Labs  Lab 04/26/24 1023 04/26/24 1706 04/27/24 0725  NA 140  --  138  K 4.1  --  3.6  CL 113*  --  111  CO2 19*  --  19*  GLUCOSE 76  --  63*  BUN 8  --  6  CREATININE 0.73 0.70 0.67  CALCIUM 8.2*  --  7.6*   Liver Function Tests: Recent Labs  Lab 04/26/24 1023  AST 33  ALT 16  ALKPHOS 160*  BILITOT 0.5  PROT 5.3*  ALBUMIN 3.3*   Recent Labs  Lab 04/26/24 1023  LIPASE 19   No results for input(s): AMMONIA in the last 168  hours. CBC: Recent Labs  Lab 04/26/24 1023 04/26/24 1706 04/27/24 0725  WBC 3.1* 3.6* 2.9*  NEUTROABS 2.0  --   --   HGB 8.9* 9.7* 9.3*  HCT 29.1* 33.1* 32.8*  MCV 83.9 86.9 88.4  PLT 158 173 174   Cardiac Enzymes: No results for input(s): CKTOTAL, CKMB, CKMBINDEX, TROPONINI in the last 168 hours. BNP: Invalid input(s): POCBNP CBG: No results for input(s): GLUCAP in the last 168 hours. D-Dimer No results for input(s): DDIMER in the last 72 hours. Hgb A1c No results for input(s): HGBA1C in the last 72 hours. Lipid Profile No results for input(s): CHOL, HDL, LDLCALC, TRIG, CHOLHDL, LDLDIRECT in the last 72 hours. Thyroid function studies No results for input(s): TSH, T4TOTAL, T3FREE, THYROIDAB in the last 72 hours.  Invalid input(s): FREET3 Anemia work up No results for input(s): VITAMINB12, FOLATE, FERRITIN, TIBC, IRON, RETICCTPCT in the last 72 hours. Urinalysis    Component Value Date/Time   COLORURINE YELLOW 04/26/2024 1025   APPEARANCEUR CLEAR 04/26/2024 1025   LABSPEC 1.009 04/26/2024 1025   PHURINE 6.5 04/26/2024 1025   GLUCOSEU NEGATIVE 04/26/2024 1025   HGBUR SMALL (A) 04/26/2024 1025   BILIRUBINUR NEGATIVE 04/26/2024 1025   KETONESUR NEGATIVE 04/26/2024 1025   PROTEINUR NEGATIVE 04/26/2024 1025   NITRITE NEGATIVE 04/26/2024 1025   LEUKOCYTESUR NEGATIVE 04/26/2024 1025   Sepsis Labs Recent Labs  Lab 04/26/24 1023 04/26/24 1706 04/27/24 0725  WBC 3.1* 3.6* 2.9*   Microbiology No results found for this or any previous visit (from the past 240 hours).   Time coordinating discharge: Over 30 minutes  SIGNED:   Camellia PARAS Arash Karstens, DO  [facility] 04/27/2024, 9:17 AM

## 2024-04-27 NOTE — TOC Transition Note (Signed)
 Transition of Care [facility]) - Discharge Note   Patient Details  Name: Deklynn Charlet MRN: [id number] Date of Birth: 07-11-1970  [facility] CM/SW Contact:  Alfonse JONELLE Rex, RN Phone Number: 04/27/2024, 10:33 AM   Clinical Narrative:   Patient dc to home, no TOC consults/needs.     Final next level of care: Home/Self Care Barriers to Discharge: No Barriers Identified   Patient Goals and CMS Choice            Discharge Placement                       Discharge Plan and Services Additional resources added to the After Visit Summary for                                       Social Drivers of Health (SDOH) Interventions SDOH Screenings   Food Insecurity: No Food Insecurity (04/26/2024)  Housing: Low Risk  (04/26/2024)  Transportation Needs: No Transportation Needs (04/26/2024)  Utilities: Not At Risk (04/26/2024)  Tobacco Use: Low Risk  (04/27/2024)  Recent Concern: Tobacco Use - Medium Risk (03/22/2024)   Received from [facility]     Readmission Risk Interventions     No data to display

## 2024-04-27 NOTE — Plan of Care (Signed)

## 2024-07-11 ENCOUNTER — Emergency Department (HOSPITAL_BASED_OUTPATIENT_CLINIC_OR_DEPARTMENT_OTHER)

## 2024-07-11 ENCOUNTER — Other Ambulatory Visit: Payer: Self-pay

## 2024-07-11 ENCOUNTER — Emergency Department (HOSPITAL_BASED_OUTPATIENT_CLINIC_OR_DEPARTMENT_OTHER)
Admission: EM | Admit: 2024-07-11 | Discharge: 2024-07-11 | Disposition: A | Discharge status: Home / Self Care | Attending: Emergency Medicine | Admitting: Emergency Medicine

## 2024-07-11 ENCOUNTER — Encounter (HOSPITAL_BASED_OUTPATIENT_CLINIC_OR_DEPARTMENT_OTHER): Payer: Self-pay

## 2024-07-11 DIAGNOSIS — R600 Localized edema: Secondary | ICD-10-CM

## 2024-07-11 DIAGNOSIS — I2510 Atherosclerotic heart disease of native coronary artery without angina pectoris: Secondary | ICD-10-CM | POA: Diagnosis not present

## 2024-07-11 DIAGNOSIS — R42 Dizziness and giddiness: Secondary | ICD-10-CM | POA: Diagnosis not present

## 2024-07-11 DIAGNOSIS — Z79899 Other long term (current) drug therapy: Secondary | ICD-10-CM | POA: Diagnosis not present

## 2024-07-11 DIAGNOSIS — E8809 Other disorders of plasma-protein metabolism, not elsewhere classified: Secondary | ICD-10-CM | POA: Insufficient documentation

## 2024-07-11 DIAGNOSIS — M7989 Other specified soft tissue disorders: Secondary | ICD-10-CM | POA: Diagnosis present

## 2024-07-11 DIAGNOSIS — M25561 Pain in right knee: Secondary | ICD-10-CM | POA: Insufficient documentation

## 2024-07-11 DIAGNOSIS — Z91040 Latex allergy status: Secondary | ICD-10-CM | POA: Diagnosis not present

## 2024-07-11 DIAGNOSIS — R748 Abnormal levels of other serum enzymes: Secondary | ICD-10-CM | POA: Diagnosis not present

## 2024-07-11 LAB — CBC WITH DIFFERENTIAL/PLATELET
Abs Immature Granulocytes: 0.01 K/uL (ref 0.00–0.07)
Basophils Absolute: 0 K/uL (ref 0.0–0.1)
Basophils Relative: 0 %
Eosinophils Absolute: 0 K/uL (ref 0.0–0.5)
Eosinophils Relative: 0 %
HCT: 29.8 % — ABNORMAL LOW (ref 36.0–46.0)
Hemoglobin: 9.2 g/dL — ABNORMAL LOW (ref 12.0–15.0)
Immature Granulocytes: 0 %
Lymphocytes Relative: 35 %
Lymphs Abs: 1.1 K/uL (ref 0.7–4.0)
MCH: 27.6 pg (ref 26.0–34.0)
MCHC: 30.9 g/dL (ref 30.0–36.0)
MCV: 89.5 fL (ref 80.0–100.0)
Monocytes Absolute: 0.2 K/uL (ref 0.1–1.0)
Monocytes Relative: 7 %
Neutro Abs: 1.8 K/uL (ref 1.7–7.7)
Neutrophils Relative %: 58 %
Platelets: 112 K/uL — ABNORMAL LOW (ref 150–400)
RBC: 3.33 MIL/uL — ABNORMAL LOW (ref 3.87–5.11)
RDW: 17.6 % — ABNORMAL HIGH (ref 11.5–15.5)
WBC: 3.1 K/uL — ABNORMAL LOW (ref 4.0–10.5)
nRBC: 0 % (ref 0.0–0.2)

## 2024-07-11 LAB — COMPREHENSIVE METABOLIC PANEL WITH GFR
ALT: 31 U/L (ref 0–44)
AST: 29 U/L (ref 15–41)
Albumin: 2.9 g/dL — ABNORMAL LOW (ref 3.5–5.0)
Alkaline Phosphatase: 210 U/L — ABNORMAL HIGH (ref 38–126)
Anion gap: 8 (ref 5–15)
BUN: 9 mg/dL (ref 6–20)
CO2: 22 mmol/L (ref 22–32)
Calcium: 7.8 mg/dL — ABNORMAL LOW (ref 8.9–10.3)
Chloride: 109 mmol/L (ref 98–111)
Creatinine, Ser: 0.87 mg/dL (ref 0.44–1.00)
GFR, Estimated: >60 mL/min
Glucose, Bld: 74 mg/dL (ref 70–99)
Potassium: 3.4 mmol/L — ABNORMAL LOW (ref 3.5–5.1)
Sodium: 139 mmol/L (ref 135–145)
Total Bilirubin: 0.7 mg/dL (ref 0.0–1.2)
Total Protein: 5.2 g/dL — ABNORMAL LOW (ref 6.5–8.1)

## 2024-07-11 LAB — PRO BRAIN NATRIURETIC PEPTIDE: Pro Brain Natriuretic Peptide: 276 pg/mL

## 2024-07-11 NOTE — ED Triage Notes (Signed)
 Presents to ED with c/o bilateral leg swelling since Friday. Also reports dizziness ongoing for two months with falls.

## 2024-07-11 NOTE — Discharge Instructions (Addendum)
 You were seen in the emergency department for your leg swelling.  You had no signs of heart failure and your kidney function was normal however your protein and albumin levels were a little bit low which might be causing some of the swelling.  You should increase your protein in your diet and you can use compression stockings to help with the swelling.  We did get an ultrasound of your legs which on the preliminary report did not show any blood clots but we will call you if the final report differs.  You should follow-up with your primary doctor to have your symptoms and your labs rechecked.  You should return to the emergency department if you are developing redness or warmth to your legs, you develop chest pain or shortness of breath or any other new or concerning symptoms.

## 2024-07-11 NOTE — ED Provider Notes (Signed)
 " [place] EMERGENC[facility] DEPARTMENT AT [facility] Provider Note   CSN: [id number] Arrival date & time: 07/11/24  1324     Patient presents with: Leg Swelling and Dizziness   Renee Joseph is a 54 y.o. female.   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  The patient states that she has had swelling that developed in her bilateral legs last Friday and has worsened over the last week.  She states that she is starting to develop some pain behind her right knee.  She denies any associated chest pain or shortness of breath.  She states that she does feel like she is urinating a little bit less than usual, denies any dysuria or hematuria. She states she has had some associated dizziness the last few days. She states that her first DVT was provoked after a hip replacement surgery but then she had a second DVT after that.  She states that she stopped anticoagulation due to frequent bruising.  The history is provided by the patient.  Dizziness      Prior to Admission medications  Medication Sig Start Date End Date Taking? Authorizing Provider  ALPRAZolam  (XANAX ) 0.5 MG tablet Take 0.5 mg by mouth daily as needed for anxiety or sleep. 06/03/21   [provider]  celecoxib (CELEBREX) 200 MG capsule Take 200 mg by mouth in the morning and at bedtime. 10/06/23   [provider]  Cholecalciferol (VITAMIN D-3) 125 MCG (5000 UT) TABS Take 5,000 Units by mouth daily.    [provider]  CREON  36000-114000 units CPEP capsule Take 36,000-72,000 Units by mouth See admin instructions. Take 36,000 units by mouth with lunch and 72,000 units with supper- IF TOLERATED 06/03/23   [provider]  cyclobenzaprine  (FLEXERIL ) 10 MG tablet Take 10 mg by mouth at bedtime as needed for muscle spasms. 10/20/21   [provider]  dabigatran (PRADAXA) 150 MG CAPS capsule Take 150 mg by mouth 2 (two) times daily.    [provider]  EMBECTA PEN NEEDLE NANO 2 GEN 32G X 4 MM MISC  04/24/24   [provider]  EPINEPHrine 0.3 mg/0.3 mL IJ SOAJ injection Inject 0.3 mg into the muscle as needed for anaphylaxis. 10/15/21   [provider]  famotidine  (PEPCID ) 40 MG tablet Take 40 mg by mouth daily. 05/23/23   [provider]  FEROSUL 325 (65 Fe) MG tablet Take 325 mg by mouth daily with breakfast. 10/10/21   [provider]  gabapentin  (NEURONTIN ) 300 MG capsule Take 300-600 mg by mouth See admin instructions. Take 300 mg by mouth in the morning and 600 mg at bedtime 10/14/21   [provider]  methocarbamol  (ROBAXIN ) 750 MG tablet Take 750 mg by mouth See admin instructions. Take 750 mg by mouth in the morning and afternoon    [provider]  metoprolol  tartrate (LOPRESSOR ) 25 MG tablet Take 1.5 tablets (37.5 mg total) by mouth 2 (two) times daily. Patient taking differently: Take 37.5 mg by mouth at bedtime. 04/19/24   Croitoru, Mihai, MD  naloxone [facility]) nasal spray 4 mg/0.1 mL Place 1 spray into the nose. 04/16/23   [provider]  omeprazole (PRILOSEC) 40 MG capsule Take 40 mg by mouth in the morning and at bedtime. 04/05/24   [provider]  ondansetron  (ZOFRAN -ODT) 4 MG disintegrating tablet 4mg  ODT  q4 hours prn nausea/vomit Patient taking differently: Take 4 mg by mouth every 4 (four) hours as needed for nausea or vomiting (dissolve orally). 05/23/21   Floyd, Dan, DO  traMADol  (ULTRAM ) 50 MG tablet Take 50 mg by mouth every 8 (eight) hours. 06/09/22   [provider]  TYMLOS 3120 MCG/1.56ML SOPN Inject 80 mcg into the skin daily. 03/09/24   [provider]  Vitamin D, Ergocalciferol, (DRISDOL) 1.25 MG (50000 UNIT) CAPS capsule Take 50,000 Units by mouth every Sunday.    [provider]  ZEPBOUND 10  MG/0.5ML Pen Inject 10 mg into the skin every Saturday. 02/13/24   [provider]  zolpidem  (AMBIEN ) 10 MG tablet Take 10 mg by mouth at bedtime. 06/05/21   [provider]    Allergies: Apixaban, Crab extract, Ketorolac, Pregabalin, Rivaroxaban, Sulfa antibiotics, Erythromycin base, Latex, Nabumetone, Penicillins, Technetium tc 103m medronate, and Wound dressing adhesive    Review of Systems  Neurological:  Positive for dizziness.    Updated Vital Signs BP (!) 88/59   Pulse 66   Temp 97.9 F (36.6 C)   Resp 15   Ht 5' 2 (1.575 m)   Wt 68 kg   SpO2 97%   BMI 27.44 kg/m   Physical Exam Vitals and nursing note reviewed.  Constitutional:      General: She is not in acute distress.    Appearance: Normal appearance.  HENT:     Head: Normocephalic and atraumatic.     Nose: Nose normal.     Mouth/Throat:     Mouth: Mucous membranes are moist.     Pharynx: Oropharynx is clear.  Eyes:     Extraocular Movements: Extraocular movements intact.     Conjunctiva/sclera: Conjunctivae normal.  Cardiovascular:     Rate and Rhythm: Normal rate and regular rhythm.     Heart sounds: Normal heart sounds.  Pulmonary:     Effort: Pulmonary effort is normal.     Breath sounds: Normal breath sounds.  Abdominal:     General: Abdomen is flat.     Palpations: Abdomen is soft.     Tenderness: There is no abdominal tenderness.  Musculoskeletal:        General: Normal range of motion.     Cervical back: Normal range of motion.     Right lower leg: Edema (Non-pitting, slightly larger than L) present.     Left lower leg: Edema (Non-pitting) present.  Skin:    General: Skin is warm and dry.     Findings: No rash.  Neurological:     General: No focal deficit present.     Mental Status: She is alert and oriented to person, place, and time.  Psychiatric:        Mood and Affect: Mood normal.        Behavior: Behavior normal.     (all labs ordered are listed, but only  abnormal results are displayed) Labs Reviewed  COMPREHENSIVE METABOLIC PANEL WITH GFR - Abnormal; Notable for the following components:      Result Value   Potassium 3.4 (*)    Calcium 7.8 (*)    Total Protein 5.2 (*)    Albumin 2.9 (*)    Alkaline Phosphatase 210 (*)    All other components within normal limits  CBC WITH DIFFERENTIAL/PLATELET - Abnormal; Notable for the following components:   WBC 3.1 (*)    RBC 3.33 (*)    Hemoglobin 9.2 (*)    HCT 29.8 (*)  RDW 17.6 (*)    Platelets 112 (*)    All other components within normal limits  PRO BRAIN NATRIURETIC PEPTIDE    EKG: EKG Interpretation Date/Time:  Tuesday July 11 2024 13:38:56 EST Ventricular Rate:  81 PR Interval:  337 QRS Duration:  80 QT Interval:  363 QTC Calculation: 422 R Axis:   4  Text Interpretation: Sinus rhythm Prolonged PR interval Low voltage, precordial leads No previous ECGs available Confirmed by Ellouise Fine ([id number]) on 07/11/2024 3:16:46 PM  Radiology: No results found.   Procedures   Medications Ordered in the ED - No data to display  Clinical Course as of 07/11/24 1742  Tue Jul 11, 2024  1629 Mild thrombocytopenia from baseline, Hgb and WBC at baseline.  [VK]  1650 Alk phos slightly elevated from baseline, corrected calcium 8.7. Hypoalbuminemia may be contributing to swelling. US  pending.  [VK]  1723 Due to IT issues, radiology imaging and reports are unable to be finalized at this time. Preliminary report of ultrasound did not show an occlusive thrombus. Suspect swelling related to PVD vs hypoalbuminemia. Will be recommended compression stockings and close PCP follow up. Patient is stable for discharge, will ensure good phone number to call back if there is a differing final report.  [VK]    Clinical Course User Index [VK] Kingsley, Kimbree Casanas K, DO                                 Medical Decision Making This patient presents to the ED with chief complaint(s) of LE swelling with  pertinent past medical history of CAD, VTE no longer on AC, venous insufficiency which further complicates the presenting complaint. The complaint involves an extensive differential diagnosis and also carries with it a high risk of complications and morbidity.    The differential diagnosis includes CHF, renal dysfunction, hepatic dysfunction, venous insufficiency, VTE, no overlying erythema or warmth making cellulitis unlikely  Additional history obtained: Additional history obtained from N/A Records reviewed Care Everywhere/External Records  ED Course and Reassessment: On patient's arrival she is hemodynamically stable in no acute distress.  EKG on arrival showed normal sinus rhythm without acute ischemic changes.  Patient will have labs to evaluate for cardiac, hepatic or renal insufficiency that may be contributing to her symptoms as well as bilateral lower extremity Dopplers with her history of VTE and tenderness behind her right knee.  She will be closely reassessed.  Independent labs interpretation:  The following labs were independently interpreted: hypoalbuminemia slightly worsening from baseline, otherwise labs approximately at baseline  Independent visualization of imaging: - N/A  Consultation: - Consulted or discussed management/test interpretation w/ external professional: N/A  Consideration for admission or further workup: Patient has no emergent conditions requiring admission or further work-up at this time and is stable for discharge home with primary care follow-up  Social Determinants of health: N/A    Amount and/or Complexity of Data Reviewed Labs: ordered.       Final diagnoses:  Peripheral edema  Hypoalbuminemia    ED Discharge Orders     None          Ellouise Fine POUR, DO 07/11/24 1742  "
